# Patient Record
Sex: Male | Born: 1975 | Race: Black or African American | Hispanic: No | Smoking: Current some day smoker
Health system: Southern US, Community
[De-identification: ages and names within clinical notes are randomized; demographics above are authoritative.]

## PROBLEM LIST (undated history)

## (undated) DIAGNOSIS — I10 Essential (primary) hypertension: Secondary | ICD-10-CM

## (undated) DIAGNOSIS — E785 Hyperlipidemia, unspecified: Secondary | ICD-10-CM

## (undated) HISTORY — DX: Essential (primary) hypertension: I10

## (undated) HISTORY — DX: Hyperlipidemia, unspecified: E78.5

---

## 2001-06-11 HISTORY — PX: WISDOM TOOTH EXTRACTION: SHX21

## 2005-12-27 ENCOUNTER — Ambulatory Visit: Payer: Self-pay | Admitting: General Practice

## 2007-04-26 IMAGING — CR DG CHEST 2V
1 series · 2 of 2 positions shown · non-contrast
Comparison: none

REASON FOR EXAM: CHEST COUGH  PLEASE FAX RESULT TO [phone]
COMMENTS:

PROCEDURE:     DXR - DXR CHEST PA (OR AP) AND LATERAL  - December 27, 2005 [DATE]
RESULT:       The lungs are clear.  The cardiac silhouette and visualized
bony skeleton are unremarkable.

[Series 1: view not recorded · 0.17mm/px · 2 of 2 slices shown]
[im 1/2]
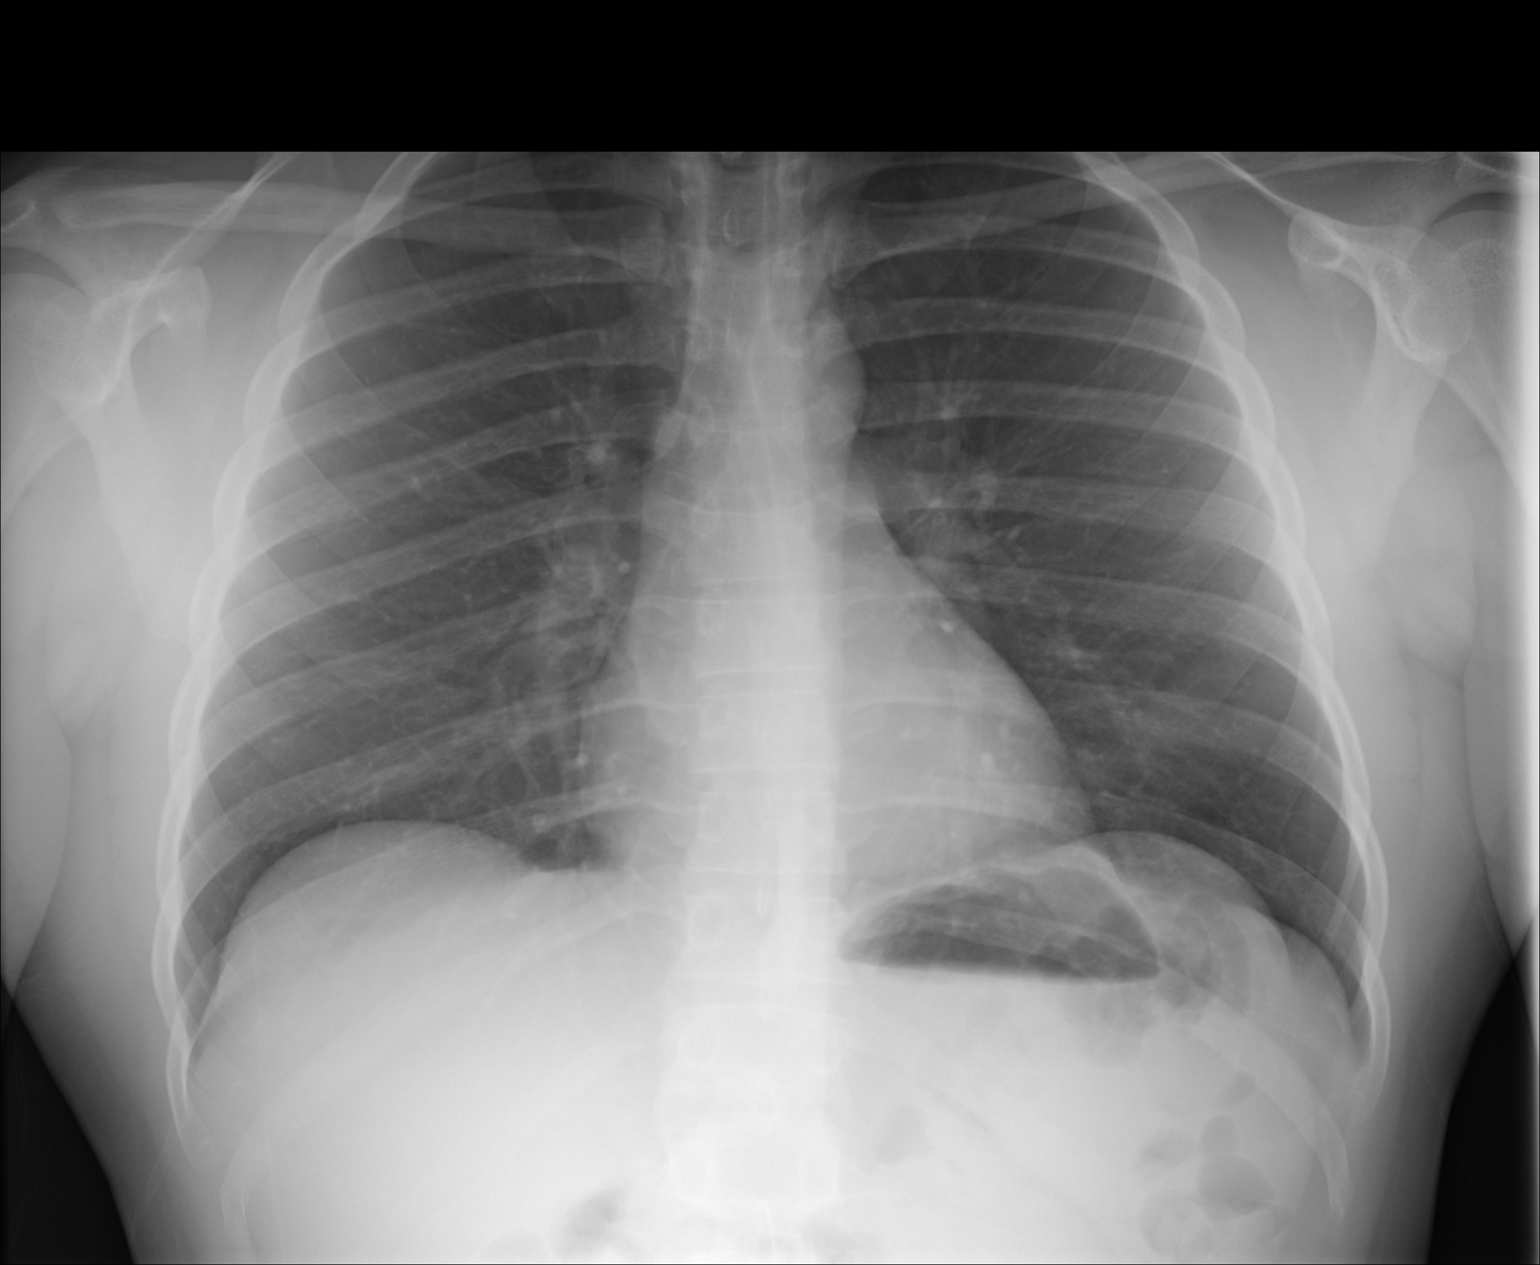
[im 2/2]
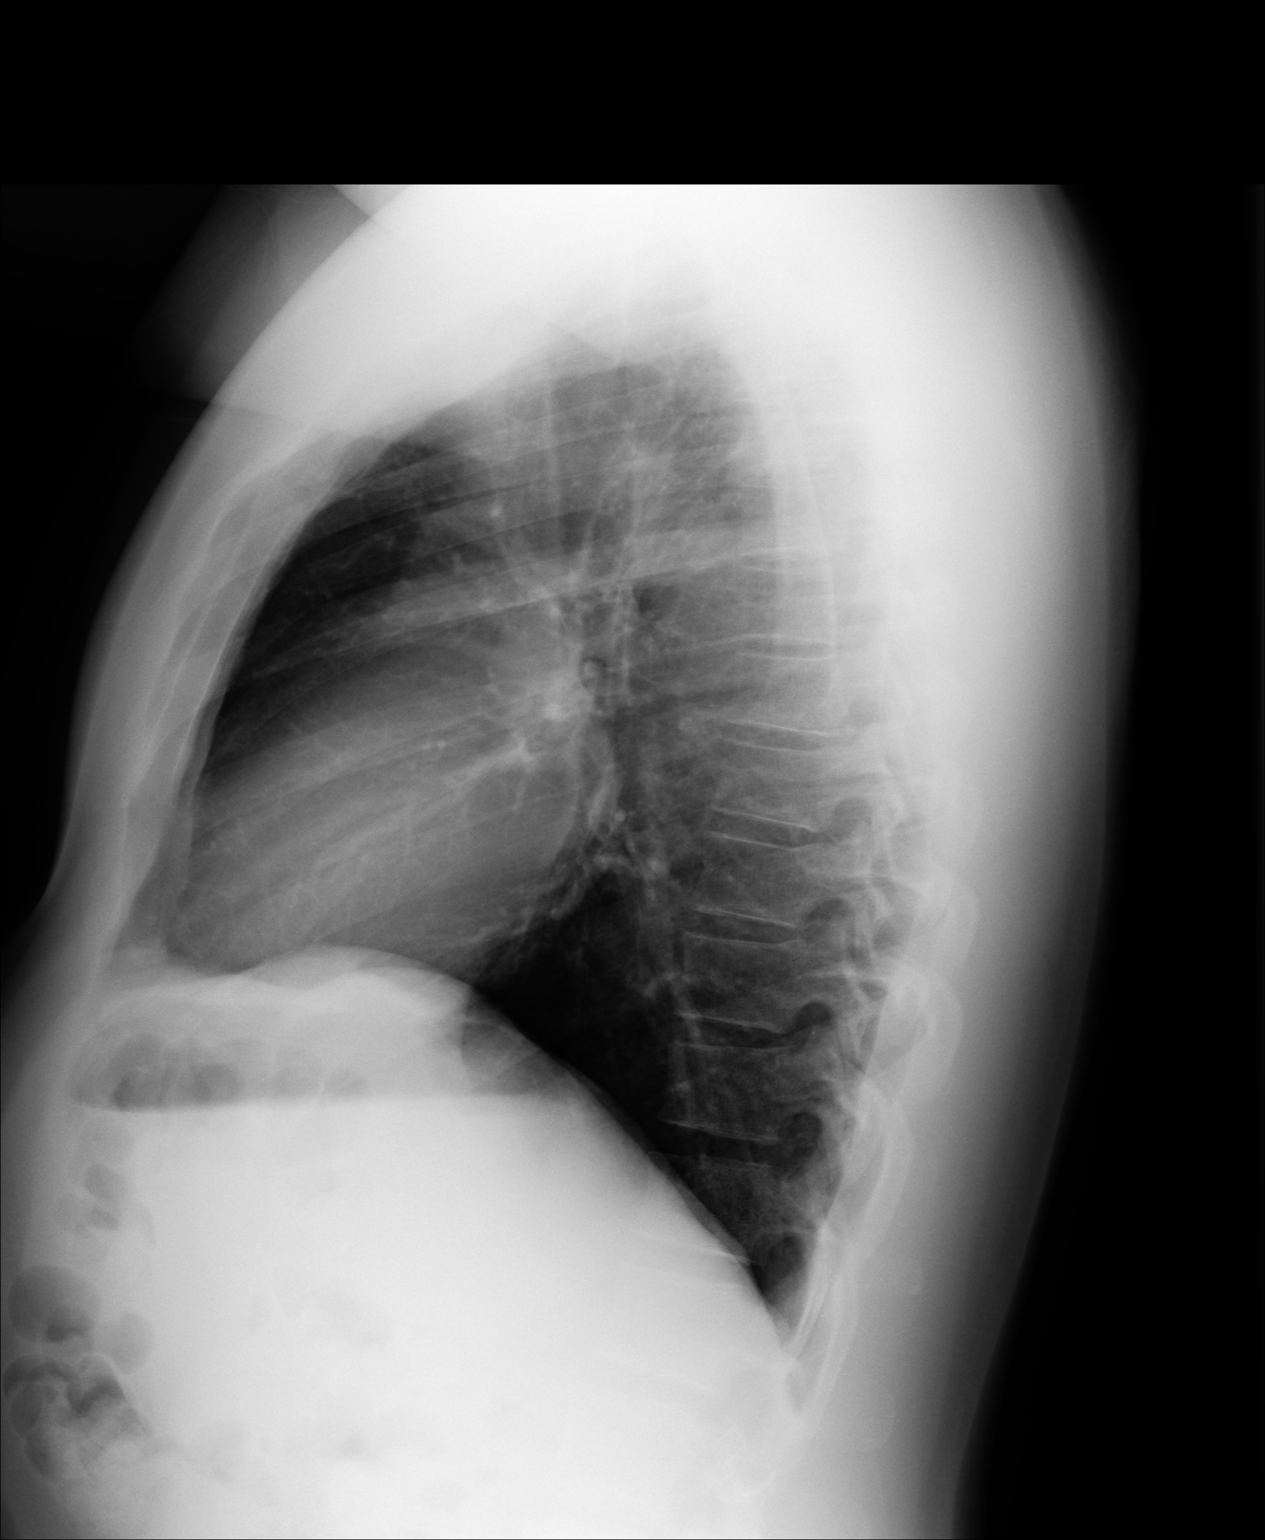

[2 of 2 positions shown; findings below may reference images not displayed]

IMPRESSION: Chest radiograph without evidence of acute cardiopulmonary
disease.

## 2007-06-08 ENCOUNTER — Emergency Department: Payer: Self-pay | Admitting: Emergency Medicine

## 2014-08-11 ENCOUNTER — Ambulatory Visit: Payer: Self-pay | Admitting: General Surgery

## 2014-08-19 ENCOUNTER — Encounter: Payer: Self-pay | Admitting: General Surgery

## 2014-08-19 ENCOUNTER — Ambulatory Visit (INDEPENDENT_AMBULATORY_CARE_PROVIDER_SITE_OTHER): Payer: BLUE CROSS/BLUE SHIELD | Admitting: General Surgery

## 2014-08-19 DIAGNOSIS — L732 Hidradenitis suppurativa: Secondary | ICD-10-CM | POA: Diagnosis not present

## 2014-08-19 NOTE — Progress Notes (Signed)
Patient ID: Keith Cohen, male   DOB: [birth year], 39 y.o.   MRN: [id number]  Chief Complaint  Patient presents with  . Other    hidradenitis    HPI Keith Cohen is a 39 y.o. male.  Here today for possible evaluation of hidradenitis both axilla. He first noticed the knots and drainage about 15 years ago and seems to have gotten worse over time. The last time he noticed drainage was 3 weeks ago. He does have some antibiotics left that he is taking because he stopped them to go on a cruise.  The right axilla improves usually before the left axilla.  Recovering from "cold" symptoms.  He did go the dermatologist about 2 years ago and took suppressive medications which helped while he was on them but reoccurred when he stopped taking the medication.   HPI  No past medical history on file.  Past Surgical History  Procedure Laterality Date  . Wisdom tooth extraction  2003    No family history on file.  Social History History  Substance Use Topics  . Smoking status: Current Some Day Smoker  . Smokeless tobacco: Current User    Types: Chew  . Alcohol Use: 0.0 oz/week    0 Standard drinks or equivalent per week     Comment: occasionally    No Known Allergies  Current Outpatient Prescriptions  Medication Sig Dispense Refill  . aspirin 81 MG tablet Take 81 mg by mouth daily.    . beta carotene w/minerals (OCUVITE) tablet Take 1 tablet by mouth daily.    . clindamycin (CLEOCIN) 300 MG capsule Take 300 mg by mouth 2 (two) times daily.  3  . COD LIVER OIL PO Take by mouth as needed.    . Multiple Vitamin (MULTIVITAMIN) capsule Take 1 capsule by mouth daily.    . Protein POWD by Does not apply route.    . rifampin (RIFADIN) 300 MG capsule Take 300 mg by mouth 2 (two) times daily.   3   No current facility-administered medications for this visit.    Review of Systems Review of Systems  Constitutional: Negative.   Respiratory: Negative.   Cardiovascular:  Negative.     Blood pressure 130/78, pulse 70, resp. rate 12, height 5\' 6"  (1.676 m), weight 195 lb (88.451 kg).  Physical Exam Physical Exam  Constitutional: He is oriented to person, place, and time. He appears well-developed and well-nourished.  Neck: Neck supple.  Cardiovascular: Normal rate, regular rhythm and normal heart sounds.   Pulmonary/Chest: Effort normal and breath sounds normal.  Abdominal: Normal appearance. No hernia.    Musculoskeletal:       Arms: Lymphadenopathy:    He has no cervical adenopathy.    He has no axillary adenopathy.  Neurological: He is alert and oriented to person, place, and time.  Skin: Skin is warm and dry.  Right lower back 1.5 cm cyst. Midline posterior lower scalp 1.5 cm cyst. Chronic skin changes 2 x 10 cm area in left axilla. Chronic skin changes 1 x 10 cm less pronounced in right axilla.       Data Reviewed PCP notes.  Assessment    Hidradenitis suppurativa:  left axilla dominant. Sebaceous cyst of the left posterior scalp and right lower back, noninflamed.    Plan    If surgical intervention were to be undertaken, I would only do the left axilla this time as there is minimal skin changes on the right. Pros and cons of surgical  excision were reviewed. 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  Patient will consider the options and notify the office of how he would like to proceed.     Discussed risk and benefits of excision hidradenitis on an outpatient basis.   PCP/REF: Kandace Parkins 08/19/2014, 10:10 AM

## 2014-08-19 NOTE — Patient Instructions (Addendum)
The patient is aware to call back for any questions or concerns.  Hidradenitis Suppurativa, Sweat Gland Abscess Hidradenitis suppurativa is a long lasting (chronic), uncommon disease of the sweat glands. With this, boil-like lumps and scarring develop in the groin, some times under the arms (axillae), and under the breasts. It may also uncommonly occur behind the ears, in the crease of the buttocks, and around the genitals.  CAUSES  The cause is from a blocking of the sweat glands. They then become infected. It may cause drainage and odor. It is not contagious. So it cannot be given to someone else. It most often shows up in puberty (about 72 to 40 years of age). But it may happen much later. It is similar to acne which is a disease of the sweat glands. This condition is slightly more common in African-Americans and women. SYMPTOMS   Hidradenitis usually starts as one or more red, tender, swellings in the groin or under the arms (axilla).  Over a period of hours to days the lesions get larger. They often open to the skin surface, draining clear to yellow-colored fluid.  The infected area heals with scarring. DIAGNOSIS  Your caregiver makes this diagnosis by looking at you. Sometimes cultures (growing germs on plates in the lab) may be taken. This is to see what germ (bacterium) is causing the infection.  TREATMENT   Topical germ killing medicine applied to the skin (antibiotics) are the treatment of choice. Antibiotics taken by mouth (systemic) are sometimes needed when the condition is getting worse or is severe.  Avoid tight-fitting clothing which traps moisture in.  Dirt does not cause hidradenitis and it is not caused by poor hygiene.  Involved areas should be cleaned daily using an antibacterial soap. Some patients find that the liquid form of Lever 2000, applied to the involved areas as a lotion after bathing, can help reduce the odor related to this condition.  Sometimes surgery is  needed to drain infected areas or remove scarred tissue. Removal of large amounts of tissue is used only in severe cases.  Birth control pills may be helpful.  Oral retinoids (vitamin A derivatives) for 6 to 12 months which are effective for acne may also help this condition.  Weight loss will improve but not cure hidradenitis. It is made worse by being overweight. But the condition is not caused by being overweight.  This condition is more common in people who have had acne.  It may become worse under stress. There is no medical cure for hidradenitis. It can be controlled, but not cured. The condition usually continues for years with periods of getting worse and getting better (remission). Document Released: 01/10/2004 Document Revised: 08/20/2011 Document Reviewed: 08/28/2013 [facility] Patient Information 2015 [place], [place]. This information is not intended to replace advice given to you by your health care provider. Make sure you discuss any questions you have with your health care provider.  Patient will call the office when ready to proceed with surgery.

## 2015-10-28 ENCOUNTER — Telehealth: Payer: Self-pay | Admitting: Family Medicine

## 2015-10-28 NOTE — Telephone Encounter (Signed)
Pt has not been seen in at least 8 years.  Pt is requesting to reestablish and have a CPE.  NO RX.  [employer].  [id number]

## 2016-02-29 ENCOUNTER — Encounter (INDEPENDENT_AMBULATORY_CARE_PROVIDER_SITE_OTHER): Payer: Self-pay

## 2016-02-29 ENCOUNTER — Encounter: Payer: Self-pay | Admitting: Physician Assistant

## 2016-02-29 ENCOUNTER — Ambulatory Visit: Payer: Self-pay | Admitting: Physician Assistant

## 2016-02-29 VITALS — BP 118/70 | HR 75 | Temp 98.4°F | Ht 67.0 in | Wt 199.0 lb

## 2016-02-29 DIAGNOSIS — Z0000 Encounter for general adult medical examination without abnormal findings: Secondary | ICD-10-CM

## 2016-02-29 DIAGNOSIS — Z299 Encounter for prophylactic measures, unspecified: Secondary | ICD-10-CM

## 2016-02-29 MED ORDER — ESZOPICLONE 2 MG PO TABS: 2.0000 mg | ORAL_TABLET | Freq: Every evening | ORAL | 3 refills | Status: DC | PRN

## 2016-02-29 NOTE — Progress Notes (Signed)
S: here for yearly physical, no complaints, no changes in his health, denies cp/sob/cough/congestion/abd pain/changes in bowel habits/changes in urinary habits/no recent weight loss, states he has gained about 10lbs since last year but hasn't been eating healthy, some problems with insomnia after working  Works EMS, +occasional smoker  Fam Hx + DM  O: vitals wnl, nad,   Ent:  tms clear, nasal mucosa pink, throat wnl, neck supple no lymph Lungs: Normal effort. Lungs are clear to auscultation, no crackles or wheezes Cardiovascular: Regular rate and rhythm, no edema Abd: soft nontender bs normal all 4 quads Musculoskeletal:  Neurovascularly intact , full rom Neurological: No new neurological deficits Rectal exam deferred by patient  A: well adult  P: diet and exercise for weight loss, lunesta 2mg  qhs prn, explained risks of becoming dependent on this medication, pt will try melatonin at 10mg  first, labs for male exec panel, hgbA1C, vit d, hep c and hiv screening

## 2016-02-29 NOTE — Addendum Note (Signed)
Addended by: Rudene Anda T on: 02/29/2016 09:58 AM   Modules accepted: Orders

## 2016-03-01 LAB — CMP12+LP+TP+TSH+6AC+PSA+CBC…
ALK PHOS: 62 IU/L (ref 39–117)
ALT: 37 IU/L (ref 0–44)
AST: 23 IU/L (ref 0–40)
Albumin/Globulin Ratio: 1.6 (ref 1.2–2.2)
Albumin: 4.5 g/dL (ref 3.5–5.5)
BASOS: 0 %
BUN / CREAT RATIO: 17 (ref 9–20)
BUN: 14 mg/dL (ref 6–24)
Basophils Absolute: 0 10*3/uL (ref 0.0–0.2)
Bilirubin Total: 0.6 mg/dL (ref 0.0–1.2)
CALCIUM: 9.6 mg/dL (ref 8.7–10.2)
CHLORIDE: 99 mmol/L (ref 96–106)
CHOL/HDL RATIO: 5.9 ratio — AB (ref 0.0–5.0)
Cholesterol, Total: 206 mg/dL — ABNORMAL HIGH (ref 100–199)
Creatinine, Ser: 0.81 mg/dL (ref 0.76–1.27)
EOS (ABSOLUTE): 0.2 10*3/uL (ref 0.0–0.4)
EOS: 3 %
Estimated CHD Risk: 1.2 times avg. — ABNORMAL HIGH (ref 0.0–1.0)
Free Thyroxine Index: 1.7 (ref 1.2–4.9)
GFR calc non Af Amer: 110 mL/min/{1.73_m2} (ref >59)
GFR, EST AFRICAN AMERICAN: 128 mL/min/{1.73_m2} (ref >59)
GGT: 40 IU/L (ref 0–65)
GLOBULIN, TOTAL: 2.8 g/dL (ref 1.5–4.5)
GLUCOSE: 119 mg/dL — AB (ref 65–99)
HDL: 35 mg/dL — ABNORMAL LOW (ref >39)
HEMATOCRIT: 43.3 % (ref 37.5–51.0)
HEMOGLOBIN: 14.9 g/dL (ref 12.6–17.7)
IMMATURE GRANS (ABS): 0 10*3/uL (ref 0.0–0.1)
IMMATURE GRANULOCYTES: 1 %
Iron: 104 ug/dL (ref 38–169)
LDH: 158 IU/L (ref 121–224)
LDL CALC: 140 mg/dL — AB (ref 0–99)
LYMPHS: 32 %
Lymphocytes Absolute: 1.7 10*3/uL (ref 0.7–3.1)
MCH: 30.1 pg (ref 26.6–33.0)
MCHC: 34.4 g/dL (ref 31.5–35.7)
MCV: 88 fL (ref 79–97)
Monocytes Absolute: 0.6 10*3/uL (ref 0.1–0.9)
Monocytes: 11 %
NEUTROS PCT: 53 %
Neutrophils Absolute: 2.9 10*3/uL (ref 1.4–7.0)
PROSTATE SPECIFIC AG, SERUM: 0.9 ng/mL (ref 0.0–4.0)
Phosphorus: 4.3 mg/dL (ref 2.5–4.5)
Platelets: 264 10*3/uL (ref 150–379)
Potassium: 4.4 mmol/L (ref 3.5–5.2)
RBC: 4.95 x10E6/uL (ref 4.14–5.80)
RDW: 12.3 % (ref 12.3–15.4)
SODIUM: 139 mmol/L (ref 134–144)
T3 Uptake Ratio: 26 % (ref 24–39)
T4, Total: 6.5 ug/dL (ref 4.5–12.0)
TRIGLYCERIDES: 157 mg/dL — AB (ref 0–149)
TSH: 1.16 u[IU]/mL (ref 0.450–4.500)
Total Protein: 7.3 g/dL (ref 6.0–8.5)
URIC ACID: 7.5 mg/dL (ref 3.7–8.6)
VLDL Cholesterol Cal: 31 mg/dL (ref 5–40)
WBC: 5.4 10*3/uL (ref 3.4–10.8)

## 2016-03-01 LAB — HCV COMMENT:

## 2016-03-01 LAB — HIV ANTIBODY (ROUTINE TESTING W REFLEX): HIV Screen 4th Generation wRfx: NONREACTIVE

## 2016-03-01 LAB — HGB A1C W/O EAG: Hgb A1c MFr Bld: 5.6 % (ref 4.8–5.6)

## 2016-03-01 LAB — VITAMIN D 25 HYDROXY (VIT D DEFICIENCY, FRACTURES): VIT D 25 HYDROXY: 19.4 ng/mL — AB (ref 30.0–100.0)

## 2016-03-01 LAB — HEPATITIS C ANTIBODY (REFLEX): HCV Ab: 0.1 s/co ratio (ref 0.0–0.9)

## 2016-03-08 LAB — ABO/RH: Rh Factor: POSITIVE

## 2016-03-08 LAB — SPECIMEN STATUS REPORT

## 2016-05-10 ENCOUNTER — Telehealth: Payer: Self-pay | Admitting: Emergency Medicine

## 2016-05-10 MED ORDER — CICLOPIROX 8 % EX SOLN: Freq: Every day | CUTANEOUS | 6 refills | Status: DC

## 2016-05-10 NOTE — Telephone Encounter (Signed)
Patient has a toe nail fungus and wants to try the [place].  Can you send it in to [facility] in [place]?

## 2016-05-10 NOTE — Telephone Encounter (Signed)
Pt called and has nail fungus, would like to try nail paint, [facility] [address]

## 2016-09-23 ENCOUNTER — Other Ambulatory Visit: Payer: Self-pay | Admitting: Physician Assistant

## 2016-09-24 NOTE — Telephone Encounter (Signed)
Med refill for vit d approved 

## 2016-10-23 ENCOUNTER — Other Ambulatory Visit: Payer: Self-pay

## 2016-10-23 NOTE — Progress Notes (Signed)
Patient came in to have his biometric screening only.  Patient already had his physical in September of 2017.

## 2017-03-27 ENCOUNTER — Ambulatory Visit: Payer: Self-pay | Admitting: Physician Assistant

## 2017-03-27 ENCOUNTER — Encounter: Payer: Self-pay | Admitting: Physician Assistant

## 2017-03-27 VITALS — BP 110/60 | HR 92 | Temp 98.5°F | Resp 16 | Ht 67.0 in | Wt 195.0 lb

## 2017-03-27 DIAGNOSIS — Z0189 Encounter for other specified special examinations: Secondary | ICD-10-CM

## 2017-03-27 DIAGNOSIS — Z0000 Encounter for general adult medical examination without abnormal findings: Secondary | ICD-10-CM

## 2017-03-27 DIAGNOSIS — Z008 Encounter for other general examination: Secondary | ICD-10-CM

## 2017-03-27 MED ORDER — TERBINAFINE HCL 250 MG PO TABS
250.0000 mg | ORAL_TABLET | Freq: Every day | ORAL | 0 refills | Status: DC
Start: 2017-03-27 — End: 2017-06-19

## 2017-03-27 NOTE — Progress Notes (Signed)
S: pt here for wellness physical and biometrics for insurance purposes, states he has a knot on his lower back, has been there about 8-9 years, just concerned, also toenails have fungus on them, used nail polish to help but it didn't work, needs refill on lunesta, no other complaints ros neg. PMH:neg    Social: works Neurosurgeon;  See chart [id number] still healthy  O: vitals wnl, nad, ENT wnl, neck supple no lymph, lungs c t a, cv rrr, abd soft nontender bs normal all 4 quads, skin with marble sized lipoma/cyst, no redness or pus, toenail on left great toe with fungal infection, discolored by 1/4 of nail  A: wellness, biometric physical, insomnia, toenail fungus, lipoma  P: rfill on lunesta 2mg  #30 3 refills, rx for lamisil 250mg  qd #30, pt to return in one month for liver enzymes, labs drawn

## 2017-03-28 LAB — CMP12+LP+TP+TSH+6AC+PSA+CBC…
A/G RATIO: 1.7 (ref 1.2–2.2)
ALT: 36 IU/L (ref 0–44)
AST: 21 IU/L (ref 0–40)
Albumin: 4.4 g/dL (ref 3.5–5.5)
Alkaline Phosphatase: 66 IU/L (ref 39–117)
BUN/Creatinine Ratio: 15 (ref 9–20)
BUN: 9 mg/dL (ref 6–24)
Basophils Absolute: 0 10*3/uL (ref 0.0–0.2)
Basos: 0 %
Bilirubin Total: 0.3 mg/dL (ref 0.0–1.2)
CREATININE: 0.6 mg/dL — AB (ref 0.76–1.27)
Calcium: 9 mg/dL (ref 8.7–10.2)
Chloride: 105 mmol/L (ref 96–106)
Chol/HDL Ratio: 6.4 ratio — ABNORMAL HIGH (ref 0.0–5.0)
Cholesterol, Total: 224 mg/dL — ABNORMAL HIGH (ref 100–199)
EOS (ABSOLUTE): 0.1 10*3/uL (ref 0.0–0.4)
ESTIMATED CHD RISK: 1.4 times avg. — AB (ref 0.0–1.0)
Eos: 2 %
Free Thyroxine Index: 1.6 (ref 1.2–4.9)
GFR calc Af Amer: 143 mL/min/{1.73_m2} (ref >59)
GFR, EST NON AFRICAN AMERICAN: 124 mL/min/{1.73_m2} (ref >59)
GGT: 58 IU/L (ref 0–65)
Globulin, Total: 2.6 g/dL (ref 1.5–4.5)
Glucose: 178 mg/dL — ABNORMAL HIGH (ref 65–99)
HDL: 35 mg/dL — AB (ref >39)
Hematocrit: 41.7 % (ref 37.5–51.0)
Hemoglobin: 14.1 g/dL (ref 13.0–17.7)
IRON: 100 ug/dL (ref 38–169)
Immature Grans (Abs): 0 10*3/uL (ref 0.0–0.1)
Immature Granulocytes: 1 %
LDH: 172 IU/L (ref 121–224)
LDL Calculated: 131 mg/dL — ABNORMAL HIGH (ref 0–99)
Lymphocytes Absolute: 2 10*3/uL (ref 0.7–3.1)
Lymphs: 35 %
MCH: 30.3 pg (ref 26.6–33.0)
MCHC: 33.8 g/dL (ref 31.5–35.7)
MCV: 90 fL (ref 79–97)
MONOS ABS: 0.4 10*3/uL (ref 0.1–0.9)
Monocytes: 8 %
NEUTROS ABS: 3.2 10*3/uL (ref 1.4–7.0)
Neutrophils: 54 %
PHOSPHORUS: 3.6 mg/dL (ref 2.5–4.5)
POTASSIUM: 3.7 mmol/L (ref 3.5–5.2)
Platelets: 253 10*3/uL (ref 150–379)
Prostate Specific Ag, Serum: 1.1 ng/mL (ref 0.0–4.0)
RBC: 4.66 x10E6/uL (ref 4.14–5.80)
RDW: 12.9 % (ref 12.3–15.4)
Sodium: 142 mmol/L (ref 134–144)
T3 UPTAKE RATIO: 27 % (ref 24–39)
T4, Total: 5.9 ug/dL (ref 4.5–12.0)
TSH: 1.01 u[IU]/mL (ref 0.450–4.500)
Total Protein: 7 g/dL (ref 6.0–8.5)
Triglycerides: 292 mg/dL — ABNORMAL HIGH (ref 0–149)
URIC ACID: 9.2 mg/dL — AB (ref 3.7–8.6)
VLDL Cholesterol Cal: 58 mg/dL — ABNORMAL HIGH (ref 5–40)
WBC: 5.8 10*3/uL (ref 3.4–10.8)

## 2017-03-28 LAB — HEPATITIS C ANTIBODY (REFLEX)

## 2017-03-28 LAB — HIV ANTIBODY (ROUTINE TESTING W REFLEX): HIV SCREEN 4TH GENERATION: NONREACTIVE

## 2017-03-28 LAB — HCV COMMENT:

## 2017-03-28 LAB — VITAMIN D 25 HYDROXY (VIT D DEFICIENCY, FRACTURES): Vit D, 25-Hydroxy: 20.2 ng/mL — ABNORMAL LOW (ref 30.0–100.0)

## 2017-03-28 NOTE — Progress Notes (Signed)
Hiv is negative also

## 2017-04-02 LAB — HGB A1C W/O EAG: HEMOGLOBIN A1C: 5.9 % — AB (ref 4.8–5.6)

## 2017-04-02 LAB — SPECIMEN STATUS REPORT

## 2017-06-18 ENCOUNTER — Other Ambulatory Visit: Payer: Self-pay

## 2017-06-18 DIAGNOSIS — Z299 Encounter for prophylactic measures, unspecified: Secondary | ICD-10-CM

## 2017-06-18 NOTE — Progress Notes (Signed)
Patient came in to have blood drawn for testing to check his liver functions for a refill for Lamisil.

## 2017-06-19 ENCOUNTER — Encounter: Payer: Self-pay | Admitting: Registered Nurse

## 2017-06-19 ENCOUNTER — Telehealth: Payer: Self-pay | Admitting: Registered Nurse

## 2017-06-19 LAB — HEPATIC FUNCTION PANEL
ALK PHOS: 66 IU/L (ref 39–117)
ALT: 43 IU/L (ref 0–44)
AST: 27 IU/L (ref 0–40)
Albumin: 4.4 g/dL (ref 3.5–5.5)
BILIRUBIN, DIRECT: 0.14 mg/dL (ref 0.00–0.40)
Bilirubin Total: 0.7 mg/dL (ref 0.0–1.2)
Total Protein: 7 g/dL (ref 6.0–8.5)

## 2017-06-19 MED ORDER — TERBINAFINE HCL 250 MG PO TABS: 250.0000 mg | ORAL_TABLET | Freq: Every day | ORAL | 0 refills | Status: DC

## 2017-06-19 NOTE — Telephone Encounter (Signed)
Patient LFTs stable with lamisil 250mg  po daily.  Electronic Rx submitted for patient to finish his 12 weeks of therapy  Terbinafine 250mg  po daily #60 RF0.  Follow up if nail discoloration continues, consider antifungal powder in shoes and open shoes wide and place in sunshine outside when weather is nice to decrease fungal counts in shoes, wash socks in hot water and dry thoroughly.

## 2018-03-31 ENCOUNTER — Encounter: Payer: Self-pay | Admitting: Family Medicine

## 2018-03-31 ENCOUNTER — Ambulatory Visit (INDEPENDENT_AMBULATORY_CARE_PROVIDER_SITE_OTHER): Payer: Managed Care, Other (non HMO) | Admitting: Family Medicine

## 2018-03-31 VITALS — BP 140/90 | HR 66 | Temp 98.4°F | Ht 67.0 in | Wt 196.2 lb

## 2018-03-31 DIAGNOSIS — E669 Obesity, unspecified: Secondary | ICD-10-CM

## 2018-03-31 DIAGNOSIS — G4726 Circadian rhythm sleep disorder, shift work type: Secondary | ICD-10-CM

## 2018-03-31 DIAGNOSIS — E559 Vitamin D deficiency, unspecified: Secondary | ICD-10-CM

## 2018-03-31 DIAGNOSIS — Z114 Encounter for screening for human immunodeficiency virus [HIV]: Secondary | ICD-10-CM

## 2018-03-31 DIAGNOSIS — Z6830 Body mass index (BMI) 30.0-30.9, adult: Secondary | ICD-10-CM

## 2018-03-31 DIAGNOSIS — Z0000 Encounter for general adult medical examination without abnormal findings: Secondary | ICD-10-CM

## 2018-03-31 DIAGNOSIS — D171 Benign lipomatous neoplasm of skin and subcutaneous tissue of trunk: Secondary | ICD-10-CM | POA: Diagnosis not present

## 2018-03-31 DIAGNOSIS — Z8042 Family history of malignant neoplasm of prostate: Secondary | ICD-10-CM

## 2018-03-31 DIAGNOSIS — L732 Hidradenitis suppurativa: Secondary | ICD-10-CM

## 2018-03-31 NOTE — Assessment & Plan Note (Signed)
Continue Lunesta as needed Taking sparingly

## 2018-03-31 NOTE — Progress Notes (Signed)
Patient: Keith Cohen, Male    DOB: 1976-06-02, 42 y.o.   MRN: [id number] Visit Date: 03/31/2018  Today's Provider: Lavon Paganini, MD   Chief Complaint  Patient presents with  . Establish Care   Subjective:  I, Keith Cohen, CMA, am acting as a scribe for Lavon Paganini, MD.   New Patient:  Keith Cohen is a 42 year old male who presents today to [facility] as a new patient. He was previously a patient at the [facility] and the employee clinic. Patient reports feeling well. He is exercising 2 days per week. He states he is sleeping well. 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  Due to the shiftwork nature of his job, he occasionally has insomnia.  He has been prescribed Lunesta in the past to take occasionally when he is switching shifts.  He only takes this about 5-6 times monthly.  Patient does smoke cigarettes.  He states he only smokes a few cigarettes daily and a pack will last for about 2 weeks.  He has been smoking for 15 to 20 years.  He does think it would be good to quit, but he is not motivated to do so yet.  Patient has a history of hidradenitis of bilateral axilla.  He denies any recent infections or flares.  He has not been on antibiotics recently.  Patient also reports a spot on his back that he would like to have looked at.  Is on the right side of his lumbar spine.  Is been stable for several years.  Prior to that, it had grown a little in size.  It is not painful or uncomfortable.  It does not drain anything.   Patient declines influenza vaccine Last tetanus shot was within the last 10 years and we will request records from his job He gets annual PSA given his family history prostate cancer He is never had colon cancer screening -----------------------------------------------------------------   Review of Systems  Constitutional: Negative.   HENT: Negative.   Eyes: Negative.   Respiratory: Negative.   Cardiovascular: Negative.    Gastrointestinal: Negative.   Endocrine: Negative.   Genitourinary: Negative.   Musculoskeletal: Negative.   Skin: Negative.   Allergic/Immunologic: Negative.   Neurological: Negative.   Hematological: Negative.   Psychiatric/Behavioral: Negative.     Social History      He  reports that he has been smoking. He has quit using smokeless tobacco.  His smokeless tobacco use included chew. He reports that he drinks alcohol. He reports that he does not use drugs.       Social History   Socioeconomic History  . Marital status: Divorced    Spouse name: Not on file  . Number of children: Not on file  . Years of education: Not on file  . Highest education level: Not on file  Occupational History  . Not on file  Social Needs  . Financial resource strain: Not on file  . Food insecurity:    Worry: Not on file    Inability: Not on file  . Transportation needs:    Medical: Not on file    Non-medical: Not on file  Tobacco Use  . Smoking status: Current Some Day Smoker  . Smokeless tobacco: Former Systems developer    Types: Chew  Substance and Sexual Activity  . Alcohol use: Yes    Alcohol/week: 0.0 standard drinks  . Drug use: No  . Sexual activity: Not on file  Lifestyle  . Physical activity:    Days per week: Not on file    Minutes per session: Not on file  . Stress: Not on file  Relationships  . Social connections:    Talks on phone: Not on file    Gets together: Not on file    Attends religious service: Not on file    Active member of club or organization: Not on file    Attends meetings of clubs or organizations: Not on file    Relationship status: Not on file  Other Topics Concern  . Not on file  Social History Narrative  . Not on file    History reviewed. No pertinent past medical history.   Patient Active Problem List   Diagnosis Date Noted  . Axillary hidradenitis suppurativa 08/19/2014    Past Surgical History:  Procedure Laterality Date  . [place]  EXTRACTION  2003    Family History        Family Status  Relation Name Status  . Mother  Alive  . Father  Alive  . PGF  Deceased  . MGM  Deceased  . MGF  Deceased  . PGM  Deceased        His family history includes Diabetes in his mother and paternal grandfather; Healthy in his father.      No Known Allergies   Current Outpatient Medications:  .  aspirin 81 MG tablet, Take 81 mg by mouth daily., Disp: , Rfl:  .  beta carotene w/minerals (OCUVITE) tablet, Take 1 tablet by mouth daily., Disp: , Rfl:  .  eszopiclone (LUNESTA) 2 MG TABS tablet, Take 1 tablet (2 mg total) by mouth at bedtime as needed for sleep. Take immediately before bedtime, Disp: 30 tablet, Rfl: 3 .  Multiple Vitamin (MULTIVITAMIN) capsule, Take 1 capsule by mouth daily., Disp: , Rfl:  .  Protein POWD, by Does not apply route., Disp: , Rfl:  .  terbinafine (LAMISIL) 250 MG tablet, Take 1 tablet (250 mg total) by mouth daily., Disp: 60 tablet, Rfl: 0 .  Vitamin D, Ergocalciferol, (DRISDOL) 50000 units CAPS capsule, TAKE ONE CAPSULE BY MOUTH WEEKLY, Disp: 12 capsule, Rfl: 3 .  COD LIVER OIL PO, Take by mouth as needed., Disp: , Rfl:    Patient Care Team: Virginia Crews, MD as PCP - General (Family Medicine) Bary Castilla Forest Gleason, MD (General Surgery)      Objective:   Vitals: BP 140/90 (BP Location: Right Arm, Patient Position: Sitting, Cuff Size: Large)   Pulse 66   Temp 98.4 F (36.9 C) (Oral)   Ht 5\' 7"  (1.702 m)   Wt 196 lb 3.2 oz (89 kg)   SpO2 96%   BMI 30.73 kg/m    Vitals:   03/31/18 0857  BP: 140/90  Pulse: 66  Temp: 98.4 F (36.9 C)  TempSrc: Oral  SpO2: 96%  Weight: 196 lb 3.2 oz (89 kg)  Height: 5\' 7"  (1.702 m)     Physical Exam  Constitutional: He is oriented to person, place, and time. He appears well-developed and well-nourished. No distress.  HENT:  Head: Normocephalic and atraumatic.  Right Ear: External ear normal.  Left Ear: External ear normal.  Nose: Nose normal.    Mouth/Throat: Oropharynx is clear and moist.  Eyes: Pupils are equal, round, and reactive to light. Conjunctivae and EOM are normal. No scleral icterus.  Neck: Neck supple. No thyromegaly present.  Cardiovascular: Normal rate, regular rhythm, normal heart sounds and intact  distal pulses.  No murmur heard. Pulmonary/Chest: Effort normal and breath sounds normal. No respiratory distress. He has no wheezes. He has no rales.  Abdominal: Soft. Bowel sounds are normal. He exhibits no distension. There is no tenderness. There is no rebound and no guarding.  Musculoskeletal: He exhibits no edema or deformity.  Lymphadenopathy:    He has no cervical adenopathy.  Neurological: He is alert and oriented to person, place, and time.  Skin: Skin is warm and dry. Capillary refill takes less than 2 seconds. No rash noted.  Soft and mobile lipoma of right lower back that is not tender to palpation  Psychiatric: He has a normal mood and affect. His behavior is normal.  Vitals reviewed.    Depression Screen PHQ 2/9 Scores 03/31/2018  PHQ - 2 Score 0  PHQ- 9 Score 0      Assessment & Plan:     Routine Health Maintenance and Physical Exam  Exercise Activities and Dietary recommendations Goals    . Quit smoking / using tobacco        There is no immunization history on file for this patient.  Health Maintenance  Topic Date Due  . TETANUS/TDAP  12/18/1993  . INFLUENZA VACCINE  10/09/2018 (Originally 01/09/2018)  . HIV Screening  Completed     Discussed health benefits of physical activity, and encouraged him to engage in regular exercise appropriate for his age and condition.    Advised to start colon cancer screening at age 45 as he is African-American --------------------------------------------------------------------  Problem List Items Addressed This Visit      Musculoskeletal and Integument   Axillary hidradenitis suppurativa    No recent flares or outbreaks Continue to  monitor        Other   Lipoma of torso    Reassurance given If starts to grow in size or become irritated/inflamed, could consider surgery referral for possible removal      Family history of prostate cancer    Check annual PSA      Relevant Orders   PSA Total (Reflex To Free)   Sleep disorder, shift-work    Continue Lunesta as needed Taking sparingly       Other Visit Diagnoses    Encounter for annual physical exam    -  Primary   Relevant Orders   Lipid panel   Comprehensive metabolic panel   VITAMIN D 25 Hydroxy (Vit-D Deficiency, Fractures)   CBC w/Diff/Platelet   PSA Total (Reflex To Free)   HIV antibody (with reflex)   Encounter for screening for HIV       Relevant Orders   HIV antibody (with reflex)   Class 1 obesity without serious comorbidity with body mass index (BMI) of 30.0 to 30.9 in adult, unspecified obesity type       Relevant Orders   Lipid panel   Comprehensive metabolic panel   CBC w/Diff/Platelet   Avitaminosis D       Relevant Orders   VITAMIN D 25 Hydroxy (Vit-D Deficiency, Fractures)       Return in about 1 year (around 04/01/2019) for CPE.   The entirety of the information documented in the History of Present Illness, Review of Systems and Physical Exam were personally obtained by me. Portions of this information were initially documented by Keith Cohen, CMA and reviewed by me for thoroughness and accuracy.    Virginia Crews, MD, MPH [facility] 03/31/2018 3:35 PM

## 2018-03-31 NOTE — Assessment & Plan Note (Signed)
No recent flares or outbreaks Continue to monitor

## 2018-03-31 NOTE — Patient Instructions (Signed)

## 2018-03-31 NOTE — Assessment & Plan Note (Signed)
Check annual PSA

## 2018-03-31 NOTE — Assessment & Plan Note (Signed)
Reassurance given If starts to grow in size or become irritated/inflamed, could consider surgery referral for possible removal

## 2018-04-01 LAB — CBC WITH DIFFERENTIAL/PLATELET
BASOS ABS: 0 10*3/uL (ref 0.0–0.2)
BASOS: 1 %
EOS (ABSOLUTE): 0.1 10*3/uL (ref 0.0–0.4)
EOS: 2 %
HEMATOCRIT: 41.6 % (ref 37.5–51.0)
HEMOGLOBIN: 14.4 g/dL (ref 13.0–17.7)
IMMATURE GRANS (ABS): 0 10*3/uL (ref 0.0–0.1)
Immature Granulocytes: 1 %
LYMPHS: 30 %
Lymphocytes Absolute: 1.7 10*3/uL (ref 0.7–3.1)
MCH: 31.3 pg (ref 26.6–33.0)
MCHC: 34.6 g/dL (ref 31.5–35.7)
MCV: 90 fL (ref 79–97)
MONOCYTES: 8 %
Monocytes Absolute: 0.5 10*3/uL (ref 0.1–0.9)
Neutrophils Absolute: 3.4 10*3/uL (ref 1.4–7.0)
Neutrophils: 58 %
Platelets: 255 10*3/uL (ref 150–450)
RBC: 4.6 x10E6/uL (ref 4.14–5.80)
RDW: 12.4 % (ref 12.3–15.4)
WBC: 5.8 10*3/uL (ref 3.4–10.8)

## 2018-04-01 LAB — LIPID PANEL
CHOLESTEROL TOTAL: 244 mg/dL — AB (ref 100–199)
Chol/HDL Ratio: 6.1 ratio — ABNORMAL HIGH (ref 0.0–5.0)
HDL: 40 mg/dL (ref >39)
TRIGLYCERIDES: 430 mg/dL — AB (ref 0–149)

## 2018-04-01 LAB — COMPREHENSIVE METABOLIC PANEL
A/G RATIO: 1.8 (ref 1.2–2.2)
ALBUMIN: 4.6 g/dL (ref 3.5–5.5)
ALK PHOS: 62 IU/L (ref 39–117)
ALT: 45 IU/L — AB (ref 0–44)
AST: 31 IU/L (ref 0–40)
BILIRUBIN TOTAL: 0.5 mg/dL (ref 0.0–1.2)
BUN/Creatinine Ratio: 19 (ref 9–20)
BUN: 13 mg/dL (ref 6–24)
CHLORIDE: 103 mmol/L (ref 96–106)
CO2: 20 mmol/L (ref 20–29)
Calcium: 9.5 mg/dL (ref 8.7–10.2)
Creatinine, Ser: 0.69 mg/dL — ABNORMAL LOW (ref 0.76–1.27)
GFR calc Af Amer: 134 mL/min/{1.73_m2} (ref >59)
GFR, EST NON AFRICAN AMERICAN: 116 mL/min/{1.73_m2} (ref >59)
GLOBULIN, TOTAL: 2.6 g/dL (ref 1.5–4.5)
Glucose: 105 mg/dL — ABNORMAL HIGH (ref 65–99)
POTASSIUM: 4 mmol/L (ref 3.5–5.2)
SODIUM: 143 mmol/L (ref 134–144)
Total Protein: 7.2 g/dL (ref 6.0–8.5)

## 2018-04-01 LAB — VITAMIN D 25 HYDROXY (VIT D DEFICIENCY, FRACTURES): VIT D 25 HYDROXY: 15.3 ng/mL — AB (ref 30.0–100.0)

## 2018-04-01 LAB — PSA TOTAL (REFLEX TO FREE): PROSTATE SPECIFIC AG, SERUM: 1.3 ng/mL (ref 0.0–4.0)

## 2018-04-01 LAB — HIV ANTIBODY (ROUTINE TESTING W REFLEX): HIV Screen 4th Generation wRfx: NONREACTIVE

## 2018-04-21 LAB — HEPATITIS B SURFACE AB-POST VC IMM ST: Hep B S Ab: POSITIVE

## 2018-07-04 ENCOUNTER — Encounter: Payer: Self-pay | Admitting: Family Medicine

## 2018-07-04 ENCOUNTER — Ambulatory Visit (INDEPENDENT_AMBULATORY_CARE_PROVIDER_SITE_OTHER): Payer: Managed Care, Other (non HMO) | Admitting: Family Medicine

## 2018-07-04 VITALS — BP 130/65 | HR 64 | Temp 98.2°F | Wt 201.8 lb

## 2018-07-04 DIAGNOSIS — E559 Vitamin D deficiency, unspecified: Secondary | ICD-10-CM | POA: Diagnosis not present

## 2018-07-04 DIAGNOSIS — E781 Pure hyperglyceridemia: Secondary | ICD-10-CM | POA: Diagnosis not present

## 2018-07-04 DIAGNOSIS — E1169 Type 2 diabetes mellitus with other specified complication: Secondary | ICD-10-CM | POA: Insufficient documentation

## 2018-07-04 DIAGNOSIS — E782 Mixed hyperlipidemia: Secondary | ICD-10-CM | POA: Insufficient documentation

## 2018-07-04 MED ORDER — VITAMIN D (ERGOCALCIFEROL) 1.25 MG (50000 UNIT) PO CAPS: 50000.0000 [IU] | ORAL_CAPSULE | ORAL | 3 refills | Status: DC

## 2018-07-04 NOTE — Assessment & Plan Note (Signed)
Will resume weekly supplement and then recheck level at next visit

## 2018-07-04 NOTE — Assessment & Plan Note (Signed)
New problem Was not fasting on last labs Will recheck FLP and reassess Discussed diet and exercise

## 2018-07-04 NOTE — Patient Instructions (Signed)

## 2018-07-04 NOTE — Progress Notes (Signed)
Patient: Keith Cohen Male    DOB: 09-03-1975   43 y.o.   MRN: [id number] Visit Date: 07/04/2018  Today's Provider: Lavon Paganini, MD   Chief Complaint  Patient presents with  . Abnormal labs   Subjective:    I, [name], CMA, am acting as a scribe for Lavon Paganini, MD.   HPI Patient presents for a 3 month follow up. Last OV was on 03/31/2018. Patient had labs done. Results showed elevated Triglycerides and Vitamin D levels were low. Patient advised to follow up in 3 months to check fasting labs.   He has not been taking Vit D supplement for a while.  Lab Results  Component Value Date   CHOL 244 (H) 03/31/2018   HDL 40 03/31/2018   LDLCALC Comment 03/31/2018   TRIG 430 (H) 03/31/2018   CHOLHDL 6.1 (H) 03/31/2018    No Known Allergies   Current Outpatient Medications:  .  aspirin 81 MG tablet, Take 81 mg by mouth daily., Disp: , Rfl:  .  beta carotene w/minerals (OCUVITE) tablet, Take 1 tablet by mouth daily., Disp: , Rfl:  .  COD LIVER OIL PO, Take by mouth as needed., Disp: , Rfl:  .  eszopiclone (LUNESTA) 2 MG TABS tablet, Take 1 tablet (2 mg total) by mouth at bedtime as needed for sleep. Take immediately before bedtime, Disp: 30 tablet, Rfl: 3 .  Multiple Vitamin (MULTIVITAMIN) capsule, Take 1 capsule by mouth daily., Disp: , Rfl:  .  Protein POWD, by Does not apply route., Disp: , Rfl:  .  Vitamin D, Ergocalciferol, (DRISDOL) 1.25 MG (50000 UT) CAPS capsule, Take 1 capsule (50,000 Units total) by mouth once a week., Disp: 12 capsule, Rfl: 3  Review of Systems  Constitutional: Negative.   Respiratory: Negative.   Cardiovascular: Negative.   Musculoskeletal: Negative.     Social History   Tobacco Use  . Smoking status: Current Some Day Smoker    Packs/day: 0.25    Years: 15.00    Pack years: 3.75    Types: Cigarettes  . Smokeless tobacco: Former Systems developer    Types: Chew  Substance Use Topics  . Alcohol use: Yes    Alcohol/week: 10.0  standard drinks    Types: 10 Cans of beer per week      Objective:   BP 130/65   Pulse 64   Temp 98.2 F (36.8 C) (Oral)   Wt 201 lb 12.8 oz (91.5 kg)   SpO2 97%   BMI 31.61 kg/m  Vitals:   07/04/18 0941 07/04/18 0953  BP: (!) 151/96 130/65  Pulse: 64   Temp: 98.2 F (36.8 C)   TempSrc: Oral   SpO2: 97%   Weight: 201 lb 12.8 oz (91.5 kg)      Physical Exam Vitals signs reviewed.  Constitutional:      General: He is not in acute distress.    Appearance: Normal appearance.  HENT:     Head: Normocephalic and atraumatic.  Eyes:     General: No scleral icterus.    Conjunctiva/sclera: Conjunctivae normal.  Neck:     Musculoskeletal: Neck supple.  Cardiovascular:     Rate and Rhythm: Normal rate and regular rhythm.     Pulses: Normal pulses.     Heart sounds: Normal heart sounds. No murmur.  Pulmonary:     Effort: Pulmonary effort is normal. No respiratory distress.     Breath sounds: Normal breath sounds. No wheezing or rhonchi.  Musculoskeletal:     Right lower leg: No edema.     Left lower leg: No edema.  Lymphadenopathy:     Cervical: No cervical adenopathy.  Skin:    Capillary Refill: Capillary refill takes less than 2 seconds.     Findings: No rash.  Neurological:     Mental Status: He is alert and oriented to person, place, and time. Mental status is at baseline.  Psychiatric:        Mood and Affect: Mood normal.        Behavior: Behavior normal.         Assessment & Plan   Problem List Items Addressed This Visit      Other   Hypertriglyceridemia - Primary    New problem Was not fasting on last labs Will recheck FLP and reassess Discussed diet and exercise      Relevant Orders   Lipid panel   Avitaminosis D    Will resume weekly supplement and then recheck level at next visit          Return if symptoms worsen or fail to improve.   The entirety of the information documented in the History of Present Illness, Review of Systems and  Physical Exam were personally obtained by me. Portions of this information were initially documented by [name], CMA and reviewed by me for thoroughness and accuracy.    Virginia Crews, MD, MPH [facility] 07/04/2018 10:05 AM

## 2018-07-05 LAB — LIPID PANEL
CHOL/HDL RATIO: 6 ratio — AB (ref 0.0–5.0)
Cholesterol, Total: 216 mg/dL — ABNORMAL HIGH (ref 100–199)
HDL: 36 mg/dL — AB (ref >39)
LDL Calculated: 138 mg/dL — ABNORMAL HIGH (ref 0–99)
TRIGLYCERIDES: 209 mg/dL — AB (ref 0–149)
VLDL CHOLESTEROL CAL: 42 mg/dL — AB (ref 5–40)

## 2018-07-07 ENCOUNTER — Telehealth: Payer: Self-pay

## 2018-07-07 MED ORDER — ESZOPICLONE 2 MG PO TABS: 2.0000 mg | ORAL_TABLET | Freq: Every evening | ORAL | 3 refills | Status: DC | PRN

## 2018-07-07 NOTE — Telephone Encounter (Signed)
LMTCB

## 2018-07-07 NOTE — Telephone Encounter (Signed)
-----   Message from Virginia Crews, MD sent at 07/07/2018  8:21 AM EST ----- Triglycerides are much better.  Cholesterol is high, but 10 year risk of heart disease/stroke is low at 4.3%. No medications indicated at this time.  Recommend diet low in saturated fats and regular exercise - 30 min/day at least 5 times weekly to lower cholesterol.

## 2018-07-07 NOTE — Telephone Encounter (Signed)
Patient advised as below.  

## 2018-09-02 ENCOUNTER — Telehealth: Payer: Self-pay

## 2018-09-02 NOTE — Telephone Encounter (Signed)
The patient called and inquired about getting a Biometric completed for the [employer]. He has recently had labs drawn and will not require the lab for the physical only the evaluation. If he has had a physical with his PCP recently he can have his PCP fill out this form and not have to come in for evaluation.

## 2018-09-03 NOTE — Telephone Encounter (Signed)
Patient's last physical was in 03/2018.  He is not due for a CPE.  We are also not doing physicals or routine things in office currently due to COVID-19 pandemic.  We are only seeing uncontrolled BP/diabetes or sick visits.

## 2019-04-03 ENCOUNTER — Encounter: Payer: Managed Care, Other (non HMO) | Admitting: Family Medicine

## 2019-04-09 ENCOUNTER — Other Ambulatory Visit: Payer: Self-pay

## 2019-04-09 ENCOUNTER — Encounter: Payer: Self-pay | Admitting: Family Medicine

## 2019-04-09 ENCOUNTER — Ambulatory Visit (INDEPENDENT_AMBULATORY_CARE_PROVIDER_SITE_OTHER): Payer: Managed Care, Other (non HMO) | Admitting: Family Medicine

## 2019-04-09 VITALS — BP 134/85 | HR 61 | Temp 96.9°F | Resp 16 | Ht 67.0 in | Wt 196.0 lb

## 2019-04-09 DIAGNOSIS — E782 Mixed hyperlipidemia: Secondary | ICD-10-CM

## 2019-04-09 DIAGNOSIS — E559 Vitamin D deficiency, unspecified: Secondary | ICD-10-CM

## 2019-04-09 DIAGNOSIS — Z0000 Encounter for general adult medical examination without abnormal findings: Secondary | ICD-10-CM

## 2019-04-09 DIAGNOSIS — E781 Pure hyperglyceridemia: Secondary | ICD-10-CM | POA: Diagnosis not present

## 2019-04-09 DIAGNOSIS — R739 Hyperglycemia, unspecified: Secondary | ICD-10-CM

## 2019-04-09 DIAGNOSIS — G4726 Circadian rhythm sleep disorder, shift work type: Secondary | ICD-10-CM | POA: Diagnosis not present

## 2019-04-09 DIAGNOSIS — Z8042 Family history of malignant neoplasm of prostate: Secondary | ICD-10-CM

## 2019-04-09 NOTE — Assessment & Plan Note (Signed)
Continue to monitor annual PSA Patient is asymptomatic 

## 2019-04-09 NOTE — Progress Notes (Signed)
Patient: Keith Cohen, Male    DOB: Nov 04, 1975, 43 y.o.   MRN: [id number] Visit Date: 04/09/2019  Today's Provider: Lavon Paganini, MD   Chief Complaint  Patient presents with  . Annual Exam   Subjective:     Annual physical exam Keith Cohen is a 43 y.o. male who presents today for health maintenance and complete physical. He feels well. He reports exercising no. He reports he is sleeping well. 03/31/2018 CPE 03/31/2018 PSA 1.3 10/16/2010 Tdap ----------------------------------------------------------------- Smoking intermittently when drinking, not daily  Drinking 6 beers per day on the weekends when not working  Review of Systems  Constitutional: Negative.   HENT: Negative.   Eyes: Negative.   Respiratory: Negative.   Cardiovascular: Negative.   Gastrointestinal: Negative.   Endocrine: Negative.   Genitourinary: Negative.   Musculoskeletal: Negative.   Skin: Negative.   Allergic/Immunologic: Negative.   Neurological: Negative.   Hematological: Negative.   Psychiatric/Behavioral: Negative.     Social History      He  reports that he has been smoking cigarettes. He has a 3.75 pack-year smoking history. He has quit using smokeless tobacco.  His smokeless tobacco use included chew. He reports current alcohol use of about 10.0 standard drinks of alcohol per week. He reports that he does not use drugs.       Social History   Socioeconomic History  . Marital status: Divorced    Spouse name: Not on file  . Number of children: 0  . Years of education: Not on file  . Highest education level: Not on file  Occupational History  . Not on file  Social Needs  . Financial resource strain: Not on file  . Food insecurity    Worry: Not on file    Inability: Not on file  . Transportation needs    Medical: Not on file    Non-medical: Not on file  Tobacco Use  . Smoking status: Current Some Day Smoker    Packs/day: 0.25    Years: 15.00    Pack  years: 3.75    Types: Cigarettes  . Smokeless tobacco: Former Systems developer    Types: Chew  Substance and Sexual Activity  . Alcohol use: Yes    Alcohol/week: 10.0 standard drinks    Types: 10 Cans of beer per week  . Drug use: No  . Sexual activity: Yes    Partners: Female    Birth control/protection: Condom  Lifestyle  . Physical activity    Days per week: Not on file    Minutes per session: Not on file  . Stress: Not on file  Relationships  . Social Herbalist on phone: Not on file    Gets together: Not on file    Attends religious service: Not on file    Active member of club or organization: Not on file    Attends meetings of clubs or organizations: Not on file    Relationship status: Not on file  Other Topics Concern  . Not on file  Social History Narrative  . Not on file    History reviewed. No pertinent past medical history.   Patient Active Problem List   Diagnosis Date Noted  . Hypertriglyceridemia 07/04/2018  . Avitaminosis D 07/04/2018  . Lipoma of torso 03/31/2018  . Family history of prostate cancer 03/31/2018  . Sleep disorder, shift-work 03/31/2018  . Axillary hidradenitis suppurativa 08/19/2014    Past Surgical History:  Procedure Laterality  Date  . WISDOM TOOTH EXTRACTION  2003    Family History        Family Status  Relation Name Status  . Mother  Alive  . Father  Alive  . PGF  Deceased  . MGM  Deceased  . MGF  Deceased  . PGM  Deceased  . Annamarie Major  (Not Specified)  . Neg Hx  (Not Specified)        His family history includes Diabetes in his mother and paternal grandfather; Healthy in his father; Prostate cancer in his paternal uncle. There is no history of Colon cancer or Breast cancer.      No Known Allergies   Current Outpatient Medications:  .  COD LIVER OIL PO, Take by mouth as needed., Disp: , Rfl:  .  eszopiclone (LUNESTA) 2 MG TABS tablet, Take 1 tablet (2 mg total) by mouth at bedtime as needed for sleep. Take  immediately before bedtime, Disp: 30 tablet, Rfl: 3 .  Multiple Vitamin (MULTIVITAMIN) capsule, Take 1 capsule by mouth daily., Disp: , Rfl:  .  Vitamin D, Ergocalciferol, (DRISDOL) 1.25 MG (50000 UT) CAPS capsule, Take 1 capsule (50,000 Units total) by mouth once a week., Disp: 12 capsule, Rfl: 3   Patient Care Team: Virginia Crews, MD as PCP - General (Family Medicine) Robert Bellow, MD (General Surgery)    Objective:    Vitals: BP 134/85 (BP Location: Right Arm, Patient Position: Sitting, Cuff Size: Normal)   Pulse 61   Temp (!) 96.9 F (36.1 C) (Temporal)   Resp 16   Ht 5\' 7"  (1.702 m)   Wt 196 lb (88.9 kg)   BMI 30.70 kg/m    Vitals:   04/09/19 0957  BP: 134/85  Pulse: 61  Resp: 16  Temp: (!) 96.9 F (36.1 C)  TempSrc: Temporal  Weight: 196 lb (88.9 kg)  Height: 5\' 7"  (1.702 m)     Physical Exam Vitals signs reviewed.  Constitutional:      General: He is not in acute distress.    Appearance: Normal appearance. He is well-developed. He is not diaphoretic.  HENT:     Head: Normocephalic and atraumatic.     Right Ear: Tympanic membrane, ear canal and external ear normal.     Left Ear: Tympanic membrane, ear canal and external ear normal.  Eyes:     General: No scleral icterus.    Conjunctiva/sclera: Conjunctivae normal.     Pupils: Pupils are equal, round, and reactive to light.  Neck:     Musculoskeletal: Neck supple.     Thyroid: No thyromegaly.  Cardiovascular:     Rate and Rhythm: Normal rate and regular rhythm.     Pulses: Normal pulses.     Heart sounds: Normal heart sounds. No murmur.  Pulmonary:     Effort: Pulmonary effort is normal. No respiratory distress.     Breath sounds: Normal breath sounds. No wheezing or rales.  Abdominal:     General: There is no distension.     Palpations: Abdomen is soft.     Tenderness: There is no abdominal tenderness.  Musculoskeletal:        General: No deformity.     Right lower leg: No edema.      Left lower leg: No edema.  Lymphadenopathy:     Cervical: No cervical adenopathy.  Skin:    General: Skin is warm and dry.     Capillary Refill: Capillary refill takes less than 2  seconds.     Findings: No rash.  Neurological:     Mental Status: He is alert and oriented to person, place, and time. Mental status is at baseline.  Psychiatric:        Mood and Affect: Mood normal.        Behavior: Behavior normal.        Thought Content: Thought content normal.      Depression Screen PHQ 2/9 Scores 04/09/2019 03/31/2018  PHQ - 2 Score 0 0  PHQ- 9 Score 0 0       Assessment & Plan:     Routine Health Maintenance and Physical Exam  Exercise Activities and Dietary recommendations Goals    . Quit smoking / using tobacco       Immunization History  Administered Date(s) Administered  . Tdap 10/16/2010    Health Maintenance  Topic Date Due  . INFLUENZA VACCINE  01/10/2019  . TETANUS/TDAP  10/15/2020  . HIV Screening  Completed     Discussed health benefits of physical activity, and encouraged him to engage in regular exercise appropriate for his age and condition.    --------------------------------------------------------------------  Problem List Items Addressed This Visit      Other   Family history of prostate cancer    Continue to monitor annual PSA Patient is asymptomatic      Relevant Orders   PSA   Sleep disorder, shift-work    Continue Lunesta as needed Taking very sparingly      Mixed hyperlipidemia    Not on a statin Repeat FLP and CMP We will calculate ASCVD risk to determine need for statin for primary prevention      Avitaminosis D    Recheck vitamin D level      Relevant Orders   VITAMIN D 25 Hydroxy (Vit-D Deficiency, Fractures)    Other Visit Diagnoses    Encounter for annual physical exam    -  Primary   Relevant Orders   CBC with Differential/Platelet   Comprehensive metabolic panel   Lipid Panel With LDL/HDL Ratio   PSA    TSH   VITAMIN D 25 Hydroxy (Vit-D Deficiency, Fractures)   Hemoglobin A1c   Hyperglycemia       Relevant Orders   Hemoglobin A1c       Return in about 1 year (around 04/08/2020) for CPE.   The entirety of the information documented in the History of Present Illness, Review of Systems and Physical Exam were personally obtained by me. Portions of this information were initially documented by Lynford Humphrey, CMA and reviewed by me for thoroughness and accuracy.    Keith Cohen, Dionne Bucy, MD MPH [facility]

## 2019-04-09 NOTE — Assessment & Plan Note (Signed)
Recheck vitamin D level 

## 2019-04-09 NOTE — Assessment & Plan Note (Signed)
Not on a statin Repeat FLP and CMP We will calculate ASCVD risk to determine need for statin for primary prevention

## 2019-04-09 NOTE — Patient Instructions (Signed)
Preventive Care 44-44 Years Old, Male Preventive care refers to lifestyle choices and visits with your health care provider that can promote health and wellness. This includes:  A yearly physical exam. This is also called an annual well check.  Regular dental and eye exams.  Immunizations.  Screening for certain conditions.  Healthy lifestyle choices, such as eating a healthy diet, getting regular exercise, not using drugs or products that contain nicotine and tobacco, and limiting alcohol use. What can I expect for my preventive care visit? Physical exam Your health care provider will check:  Height and weight. These may be used to calculate body mass index (BMI), which is a measurement that tells if you are at a healthy weight.  Heart rate and blood pressure.  Your skin for abnormal spots. Counseling Your health care provider may ask you questions about:  Alcohol, tobacco, and drug use.  Emotional well-being.  Home and relationship well-being.  Sexual activity.  Eating habits.  Work and work environment. What immunizations do I need?  Influenza (flu) vaccine  This is recommended every year. Tetanus, diphtheria, and pertussis (Tdap) vaccine  You may need a Td booster every 10 years. Varicella (chickenpox) vaccine  You may need this vaccine if you have not already been vaccinated. Zoster (shingles) vaccine  You may need this after age 60. Measles, mumps, and rubella (MMR) vaccine  You may need at least one dose of MMR if you were born in 1957 or later. You may also need a second dose. Pneumococcal conjugate (PCV13) vaccine  You may need this if you have certain conditions and were not previously vaccinated. Pneumococcal polysaccharide (PPSV23) vaccine  You may need one or two doses if you smoke cigarettes or if you have certain conditions. Meningococcal conjugate (MenACWY) vaccine  You may need this if you have certain conditions. Hepatitis A vaccine   You may need this if you have certain conditions or if you travel or work in places where you may be exposed to hepatitis A. Hepatitis B vaccine  You may need this if you have certain conditions or if you travel or work in places where you may be exposed to hepatitis B. Haemophilus influenzae type b (Hib) vaccine  You may need this if you have certain risk factors. Human papillomavirus (HPV) vaccine  If recommended by your health care provider, you may need three doses over 6 months. You may receive vaccines as individual doses or as more than one vaccine together in one shot (combination vaccines). Talk with your health care provider about the risks and benefits of combination vaccines. What tests do I need? Blood tests  Lipid and cholesterol levels. These may be checked every 5 years, or more frequently if you are over 44 years old.  Hepatitis C test.  Hepatitis B test. Screening  Lung cancer screening. You may have this screening every year starting at age 44 if you have a 30-pack-year history of smoking and currently smoke or have quit within the past 15 years.  Prostate cancer screening. Recommendations will vary depending on your family history and other risks.  Colorectal cancer screening. All adults should have this screening starting at age 44 and continuing until age 75. Your health care provider may recommend screening at age 45 if you are at increased risk. You will have tests every 1-10 years, depending on your results and the type of screening test.  Diabetes screening. This is done by checking your blood sugar (glucose) after you have not eaten   for a while (fasting). You may have this done every 1-3 years.  Sexually transmitted disease (STD) testing. Follow these instructions at home: Eating and drinking  Eat a diet that includes fresh fruits and vegetables, whole grains, lean protein, and low-fat dairy products.  Take vitamin and mineral supplements as recommended  by your health care provider.  Do not drink alcohol if your health care provider tells you not to drink.  If you drink alcohol: ? Limit how much you have to 0-2 drinks a day. ? Be aware of how much alcohol is in your drink. In the [place] one drink equals one 12 oz bottle of beer (355 mL), one 5 oz glass of wine (148 mL), or one 1 oz glass of hard liquor (44 mL). Lifestyle  Take daily care of your teeth and gums.  Stay active. Exercise for at least 30 minutes on 5 or more days each week.  Do not use any products that contain nicotine or tobacco, such as cigarettes, e-cigarettes, and chewing tobacco. If you need help quitting, ask your health care provider.  If you are sexually active, practice safe sex. Use a condom or other form of protection to prevent STIs (sexually transmitted infections).  Talk with your health care provider about taking a low-dose aspirin every day starting at age 44. What's next?  Go to your health care provider once a year for a well check visit.  Ask your health care provider how often you should have your eyes and teeth checked.  Stay up to date on all vaccines. This information is not intended to replace advice given to you by your health care provider. Make sure you discuss any questions you have with your health care provider. Document Released: 06/24/2015 Document Revised: 05/22/2018 Document Reviewed: 05/22/2018 Elsevier Patient Education  2020 [employer].

## 2019-04-09 NOTE — Assessment & Plan Note (Signed)
Continue Lunesta as needed Taking very sparingly

## 2019-04-10 LAB — COMPREHENSIVE METABOLIC PANEL
ALT: 30 IU/L (ref 0–44)
AST: 19 IU/L (ref 0–40)
Albumin/Globulin Ratio: 2.4 — ABNORMAL HIGH (ref 1.2–2.2)
Albumin: 4.3 g/dL (ref 4.0–5.0)
Alkaline Phosphatase: 70 IU/L (ref 39–117)
BUN/Creatinine Ratio: 12 (ref 9–20)
BUN: 10 mg/dL (ref 6–24)
Bilirubin Total: 0.5 mg/dL (ref 0.0–1.2)
CO2: 21 mmol/L (ref 20–29)
Calcium: 7.5 mg/dL — ABNORMAL LOW (ref 8.7–10.2)
Chloride: 102 mmol/L (ref 96–106)
Creatinine, Ser: 0.83 mg/dL (ref 0.76–1.27)
GFR calc Af Amer: 124 mL/min/{1.73_m2} (ref >59)
GFR calc non Af Amer: 107 mL/min/{1.73_m2} (ref >59)
Globulin, Total: 1.8 g/dL (ref 1.5–4.5)
Glucose: 91 mg/dL (ref 65–99)
Potassium: 4 mmol/L (ref 3.5–5.2)
Sodium: 140 mmol/L (ref 134–144)
Total Protein: 6.1 g/dL (ref 6.0–8.5)

## 2019-04-10 LAB — VITAMIN D 25 HYDROXY (VIT D DEFICIENCY, FRACTURES): Vit D, 25-Hydroxy: 21.3 ng/mL — ABNORMAL LOW (ref 30.0–100.0)

## 2019-04-10 LAB — CBC WITH DIFFERENTIAL/PLATELET
Basophils Absolute: 0 10*3/uL (ref 0.0–0.2)
Basos: 0 %
EOS (ABSOLUTE): 0.2 10*3/uL (ref 0.0–0.4)
Eos: 2 %
Hematocrit: 40.8 % (ref 37.5–51.0)
Hemoglobin: 14.4 g/dL (ref 13.0–17.7)
Immature Grans (Abs): 0 10*3/uL (ref 0.0–0.1)
Immature Granulocytes: 1 %
Lymphocytes Absolute: 1.8 10*3/uL (ref 0.7–3.1)
Lymphs: 28 %
MCH: 31.4 pg (ref 26.6–33.0)
MCHC: 35.3 g/dL (ref 31.5–35.7)
MCV: 89 fL (ref 79–97)
Monocytes Absolute: 0.5 10*3/uL (ref 0.1–0.9)
Monocytes: 8 %
Neutrophils Absolute: 3.9 10*3/uL (ref 1.4–7.0)
Neutrophils: 61 %
Platelets: 197 10*3/uL (ref 150–450)
RBC: 4.59 x10E6/uL (ref 4.14–5.80)
RDW: 11.8 % (ref 11.6–15.4)
WBC: 6.3 10*3/uL (ref 3.4–10.8)

## 2019-04-10 LAB — LIPID PANEL WITH LDL/HDL RATIO
Cholesterol, Total: 207 mg/dL — ABNORMAL HIGH (ref 100–199)
HDL: 38 mg/dL — ABNORMAL LOW (ref >39)
LDL Chol Calc (NIH): 114 mg/dL — ABNORMAL HIGH (ref 0–99)
LDL/HDL Ratio: 3 ratio (ref 0.0–3.6)
Triglycerides: 317 mg/dL — ABNORMAL HIGH (ref 0–149)
VLDL Cholesterol Cal: 55 mg/dL — ABNORMAL HIGH (ref 5–40)

## 2019-04-10 LAB — PSA: Prostate Specific Ag, Serum: 2.4 ng/mL (ref 0.0–4.0)

## 2019-04-10 LAB — HEMOGLOBIN A1C
Est. average glucose Bld gHb Est-mCnc: 120 mg/dL
Hgb A1c MFr Bld: 5.8 % — ABNORMAL HIGH (ref 4.8–5.6)

## 2019-04-10 LAB — TSH: TSH: 2.64 u[IU]/mL (ref 0.450–4.500)

## 2019-04-13 ENCOUNTER — Telehealth: Payer: Self-pay

## 2019-04-13 DIAGNOSIS — R972 Elevated prostate specific antigen [PSA]: Secondary | ICD-10-CM

## 2019-04-13 NOTE — Telephone Encounter (Signed)
-----   Message from Virginia Crews, MD sent at 04/10/2019  4:42 PM EDT ----- Normal Blood counts, kidney function, liver function, thyroid function.  Cholesterol remains slightly elevated and stable. I recommend diet low in saturated fat and regular exercise - 30 min at least 5 times per week.  PSA has nearly doubled since last year.  Is he having any prostate symptoms?  It is still in the normal range, but would recommend seeing urology.  Hemoglobin A1c, 58-month average of blood sugars, remains in the prediabetic range.  Recommend regular exercise and low-carb diet.  Calcium is low, which is likely related to vitamin D deficiency.  Recommend that he starts taking calcium and vitamin D supplement.  Recommend vitamin D3 1000 to 2000 units daily.

## 2019-04-13 NOTE — Telephone Encounter (Signed)
Tried calling; pt's mailbox is full.   Thanks,   -Laura  

## 2019-04-14 NOTE — Telephone Encounter (Signed)
Patient advised as below. Patient denies any symptoms related to prostate. Patient agreed to urology referral.

## 2019-04-14 NOTE — Telephone Encounter (Signed)
Left message on [phone]  Thanks,   -Mickel Baas

## 2019-04-15 NOTE — Telephone Encounter (Signed)
OK to place referral to Urology for elevated PSA

## 2019-05-26 ENCOUNTER — Ambulatory Visit: Payer: Managed Care, Other (non HMO) | Admitting: Urology

## 2019-05-28 ENCOUNTER — Other Ambulatory Visit: Payer: Self-pay | Admitting: Family Medicine

## 2019-05-28 NOTE — Telephone Encounter (Signed)
Requested medication (s) are due for refill today: yes  Requested medication (s) are on the active medication list: yes  Last refill:  03/14/2019  Future visit scheduled: yes  Notes to clinic:  50,000 IU strengths are not delegated   Requested Prescriptions  Pending Prescriptions Disp Refills   Vitamin D, Ergocalciferol, (DRISDOL) 1.25 MG (50000 UT) CAPS capsule [Pharmacy Med Name: VITAMIN D2 1.25MG (50,000 UNIT)] 12 capsule 3    Sig: TAKE 1 CAPSULE BY MOUTH ONE TIME PER WEEK      Endocrinology:  Vitamins - Vitamin D Supplementation Failed - 05/28/2019  3:54 AM      Failed - 50,000 IU strengths are not delegated      Failed - Ca in normal range and within 360 days    Calcium  Date Value Ref Range Status  04/09/2019 7.5 (L) 8.7 - 10.2 mg/dL Final          Failed - Phosphate in normal range and within 360 days    Phosphorus  Date Value Ref Range Status  03/27/2017 3.6 2.5 - 4.5 mg/dL Final          Failed - Vitamin D in normal range and within 360 days    Vit D, 25-Hydroxy  Date Value Ref Range Status  04/09/2019 21.3 (L) 30.0 - 100.0 ng/mL Final    Comment:    Vitamin D deficiency has been defined by the Institute of Medicine and an Endocrine Society practice guideline as a level of serum 25-OH vitamin D less than 20 ng/mL (1,2). The Endocrine Society went on to further define vitamin D insufficiency as a level between 21 and 29 ng/mL (2). 1. IOM (Institute of Medicine). 2010. Dietary reference    intakes for calcium and D. [place]: The    [employer]. 2. Holick MF, Binkley [place], Bischoff-Ferrari HA, et al.    Evaluation, treatment, and prevention of vitamin D    deficiency: an Endocrine Society clinical practice    guideline. JCEM. 2011 Jul; 96(7):1911-30.           Passed - Valid encounter within last 12 months    Recent Outpatient Visits           1 month ago Encounter for annual physical exam   [facility], Dionne Bucy, MD   10 months ago Hypertriglyceridemia   [facility], Dionne Bucy, MD   1 year ago Encounter for annual physical exam   [facility], Dionne Bucy, MD       Future Appointments             In 3 weeks Hollice Espy, MD [place]

## 2019-06-02 ENCOUNTER — Ambulatory Visit: Payer: Managed Care, Other (non HMO) | Admitting: Urology

## 2019-06-24 ENCOUNTER — Encounter: Payer: Self-pay | Admitting: Urology

## 2019-06-24 ENCOUNTER — Ambulatory Visit (INDEPENDENT_AMBULATORY_CARE_PROVIDER_SITE_OTHER): Payer: Managed Care, Other (non HMO) | Admitting: Urology

## 2019-06-24 ENCOUNTER — Other Ambulatory Visit: Payer: Self-pay

## 2019-06-24 VITALS — BP 171/102 | HR 67 | Ht 67.0 in | Wt 200.0 lb

## 2019-06-24 DIAGNOSIS — R972 Elevated prostate specific antigen [PSA]: Secondary | ICD-10-CM | POA: Diagnosis not present

## 2019-06-24 DIAGNOSIS — R351 Nocturia: Secondary | ICD-10-CM | POA: Diagnosis not present

## 2019-06-24 NOTE — Progress Notes (Signed)
06/24/2019 5:04 PM   Keith Cohen [birth year] [id number]  Referring provider: Virginia Crews, MD [address] [place],  [place] [address]  Chief Complaint  Patient presents with  . Elevated PSA    HPI: 44 year old male who presents today for further evaluation of rising PSA.  He was noted to have a PSA of 1.3 in 03/2018.  This was rechecked again this year by his primary care physician on 2019-04-09 at which time his PSA was noted to be 2.4.  This fairly significant rise to today's visit.  Although documented by his primary care physician, he does not in fact have a family history of prostate cancer.  He denies any significant urinary symptoms.  He does get up once or twice at night to void but does not bother him.  He does not watch what he drinks before he goes to bed.  No weight loss or bone pain.  He is very fit and works out.  He uses over-the-counter supplements that he buys from a nutrition shop.  He was taking whey protein but more recently switched over to capsules.  He cannot recall the name of it.  He denies any testosterone use.  He does report that he ejaculated this morning before the appointment.  PMH: No past medical history on file.  Surgical History: Past Surgical History:  Procedure Laterality Date  . WISDOM TOOTH EXTRACTION  2003    Home Medications:  Allergies as of 06/24/2019   No Known Allergies     Medication List       Accurate as of June 24, 2019  5:04 PM. If you have any questions, ask your nurse or doctor.        COD LIVER OIL PO Take by mouth as needed.   eszopiclone 2 MG Tabs tablet Commonly known as: LUNESTA Take 1 tablet (2 mg total) by mouth at bedtime as needed for sleep. Take immediately before bedtime   multivitamin capsule Take 1 capsule by mouth daily.   Vitamin D (Ergocalciferol) 1.25 MG (50000 UNIT) Caps capsule Commonly known as: DRISDOL TAKE 1 CAPSULE BY MOUTH ONE TIME PER WEEK       Allergies: No Known Allergies  Family History: Family History  Problem Relation Age of Onset  . Diabetes Mother   . Healthy Father   . Diabetes Paternal Grandfather   . Prostate cancer Paternal Uncle   . Colon cancer Neg Hx   . Breast cancer Neg Hx     Social History:  reports that he has been smoking cigarettes. He has a 3.75 pack-year smoking history. He has quit using smokeless tobacco.  His smokeless tobacco use included chew. He reports current alcohol use of about 10.0 standard drinks of alcohol per week. He reports that he does not use drugs.  ROS: UROLOGY Frequent Urination?: Yes Hard to postpone urination?: No Burning/pain with urination?: No Get up at night to urinate?: Yes Leakage of urine?: No Urine stream starts and stops?: No Trouble starting stream?: No Do you have to strain to urinate?: No Blood in urine?: No Urinary tract infection?: No Sexually transmitted disease?: No Injury to kidneys or bladder?: No Painful intercourse?: No Weak stream?: No Erection problems?: No Penile pain?: No  Gastrointestinal Nausea?: No Vomiting?: No Indigestion/heartburn?: No Diarrhea?: No Constipation?: No  Constitutional Fever: No Night sweats?: No Weight loss?: No Fatigue?: No  Skin Skin rash/lesions?: No Itching?: No  Eyes Blurred vision?: No Double vision?: No  Ears/Nose/Throat Sore throat?:  No Sinus problems?: No  Hematologic/Lymphatic Swollen glands?: No Easy bruising?: No  Cardiovascular Leg swelling?: No Chest pain?: No  Respiratory Cough?: No Shortness of breath?: No  Endocrine Excessive thirst?: No  Musculoskeletal Back pain?: No Joint pain?: No  Neurological Headaches?: No Dizziness?: No  Psychologic Depression?: No Anxiety?: No  Physical Exam: BP (!) 171/102   Pulse 67   Ht 5\' 7"  (1.702 m)   Wt 200 lb (90.7 kg)   BMI 31.32 kg/m   Constitutional:  Alert and oriented, No acute distress. HEENT: [place] AT, moist mucus  membranes.  Trachea midline, no masses. Cardiovascular: No clubbing, cyanosis, or edema. Respiratory: Normal respiratory effort, no increased work of breathing. GI: Abdomen is soft, nontender, nondistended, no abdominal masses Rectal: 30 cc prostate, nontender, no nodules. Skin: No rashes, bruises or suspicious lesions. Neurologic: Grossly intact, no focal deficits, moving all 4 extremities. Psychiatric: Normal mood and affect.  Laboratory Data: Lab Results  Component Value Date   WBC 6.3 04/09/2019   HGB 14.4 04/09/2019   HCT 40.8 04/09/2019   MCV 89 04/09/2019   PLT 197 04/09/2019    Lab Results  Component Value Date   CREATININE 0.83 04/09/2019   PSA as above  Lab Results  Component Value Date   HGBA1C 5.8 (H) 04/09/2019    Urinalysis UA today reviewed, completely negative.  See epic.   Assessment & Plan:    1. Rising PSA level We discussed PSA screening guidelines.  Generally, we do not recommend PSA screening lessers a family history of prostate cancer at his age.  He denies this.  His PSA has risen greater than a generally excepted cutoffs of 0.75 ng/dL over the past year thus does raise flags.   We reviewed the implications of an elevated PSA and the uncertainty surrounding it. In general, a man's PSA increases with age and is produced by both normal and cancerous prostate tissue. Differential for elevated PSA is BPH, prostate cancer, infection, recent intercourse/ejaculation, prostate infarction, recent urethroscopic manipulation (foley placement/cystoscopy) and prostatitis. Management of an elevated PSA can include observation or prostate biopsy and wediscussed this in detail.  Plan to repeat his PSA today.  We will call him tomorrow with these results.  If his PSA goes back down to his previous value, he can be followed up by his primary care.  If his PSA continues to rise or does not trend back down, will likely follow him closely.  He is agreeable this plan.   He also mention today that he may want to try to have his PSA drawn 1 additional time with abstinence for 3 days prior to blood draw to see if that makes a difference.  Will assess the need for that based on his labs which are pending today.  All questions answered. - PSA  2. Nocturia Behavioral modification discussed  Overall minimal bother  Urine today is unremarkable - Urinalysis, Complete   Return for will call with PSA results and recommendations.  Hollice Espy, MD  [facility] [address], [place], [place] [address] [phone]

## 2019-06-25 LAB — URINALYSIS, COMPLETE
Bilirubin, UA: NEGATIVE
Glucose, UA: NEGATIVE
Ketones, UA: NEGATIVE
Leukocytes,UA: NEGATIVE
Nitrite, UA: NEGATIVE
Specific Gravity, UA: 1.02 (ref 1.005–1.030)
Urobilinogen, Ur: 1 mg/dL (ref 0.2–1.0)
pH, UA: 7.5 (ref 5.0–7.5)

## 2019-06-25 LAB — MICROSCOPIC EXAMINATION: Bacteria, UA: NONE SEEN

## 2019-06-25 LAB — PSA: Prostate Specific Ag, Serum: 1.7 ng/mL (ref 0.0–4.0)

## 2019-07-07 ENCOUNTER — Other Ambulatory Visit: Payer: Self-pay | Admitting: Family Medicine

## 2019-07-07 NOTE — Telephone Encounter (Signed)
eszopiclone (LUNESTA) 2 MG TABS tablet     Patient is requesting a refill.    Pharmacy:  [id number] - [place], [place] - [address] Phone:  [phone]  Fax:  [phone]

## 2019-07-08 MED ORDER — ESZOPICLONE 2 MG PO TABS: 2.0000 mg | ORAL_TABLET | Freq: Every evening | ORAL | 3 refills | Status: DC | PRN

## 2020-02-16 ENCOUNTER — Other Ambulatory Visit: Payer: Self-pay | Admitting: Family Medicine

## 2020-02-16 NOTE — Telephone Encounter (Signed)
Requested medication (s) are due for refill today - yes  Requested medication (s) are on the active medication list -yes  Future visit scheduled - yes  Last refill: 07/08/19 #30 3 RF  Notes to clinic: request for non delegated Rx  Requested Prescriptions  Pending Prescriptions Disp Refills   eszopiclone (LUNESTA) 2 MG TABS tablet [Pharmacy Med Name: ESZOPICLONE 2 MG TABLET] 30 tablet     Sig: Take 1 tablet (2 mg total) by mouth at bedtime as needed for sleep. Take immediately before bedtime      Not Delegated - Psychiatry:  Anxiolytics/Hypnotics Failed - 02/16/2020 12:22 PM      Failed - This refill cannot be delegated      Failed - Urine Drug Screen completed in last 360 days.      Failed - Valid encounter within last 6 months    Recent Outpatient Visits           10 months ago Encounter for annual physical exam   [facility] [place], Dionne Bucy, MD   1 year ago Hypertriglyceridemia   [facility] [place], Dionne Bucy, MD   1 year ago Encounter for annual physical exam   [facility] [place], Dionne Bucy, MD       Future Appointments             In 1 month Bacigalupo, Dionne Bucy, MD [facility]                Requested Prescriptions  Pending Prescriptions Disp Refills   eszopiclone (LUNESTA) 2 MG TABS tablet [Pharmacy Med Name: ESZOPICLONE 2 MG TABLET] 30 tablet     Sig: Take 1 tablet (2 mg total) by mouth at bedtime as needed for sleep. Take immediately before bedtime      Not Delegated - Psychiatry:  Anxiolytics/Hypnotics Failed - 02/16/2020 12:22 PM      Failed - This refill cannot be delegated      Failed - Urine Drug Screen completed in last 360 days.      Failed - Valid encounter within last 6 months    Recent Outpatient Visits           10 months ago Encounter for annual physical exam   [facility] [place], Dionne Bucy, MD   1 year ago Hypertriglyceridemia   [facility] [place], Dionne Bucy, MD   1 year ago Encounter for annual physical exam   [facility] [place], Dionne Bucy, MD       Future Appointments             In 1 month Bacigalupo, Dionne Bucy, MD [facility] [place]

## 2020-03-08 ENCOUNTER — Encounter: Payer: Self-pay | Admitting: Physician Assistant

## 2020-03-08 ENCOUNTER — Other Ambulatory Visit: Payer: Self-pay

## 2020-03-08 ENCOUNTER — Ambulatory Visit: Payer: Managed Care, Other (non HMO) | Admitting: Physician Assistant

## 2020-03-08 VITALS — BP 148/98 | HR 83 | Temp 97.8°F | Resp 18 | Ht 68.0 in | Wt 187.0 lb

## 2020-03-08 DIAGNOSIS — Z0000 Encounter for general adult medical examination without abnormal findings: Secondary | ICD-10-CM | POA: Diagnosis not present

## 2020-03-08 DIAGNOSIS — Z008 Encounter for other general examination: Secondary | ICD-10-CM

## 2020-03-08 NOTE — Patient Instructions (Signed)
Low-Sodium Eating Plan Sodium, which is an element that makes up salt, helps you maintain a healthy balance of fluids in your body. Too much sodium can increase your blood pressure and cause fluid and waste to be held in your body. Your health care provider or dietitian may recommend following this plan if you have high blood pressure (hypertension), kidney disease, liver disease, or heart failure. Eating less sodium can help lower your blood pressure, reduce swelling, and protect your heart, liver, and kidneys. What are tips for following this plan? General guidelines  Most people on this plan should limit their sodium intake to 1,500-2,000 mg (milligrams) of sodium each day. Reading food labels   The Nutrition Facts label lists the amount of sodium in one serving of the food. If you eat more than one serving, you must multiply the listed amount of sodium by the number of servings.  Choose foods with less than 140 mg of sodium per serving.  Avoid foods with 300 mg of sodium or more per serving. Shopping  Look for lower-sodium products, often labeled as "low-sodium" or "no salt added."  Always check the sodium content even if foods are labeled as "unsalted" or "no salt added".  Buy fresh foods. ? Avoid canned foods and premade or frozen meals. ? Avoid canned, cured, or processed meats  Buy breads that have less than 80 mg of sodium per slice. Cooking  Eat more home-cooked food and less restaurant, buffet, and fast food.  Avoid adding salt when cooking. Use salt-free seasonings or herbs instead of table salt or sea salt. Check with your health care provider or pharmacist before using salt substitutes.  Cook with plant-based oils, such as canola, sunflower, or olive oil. Meal planning  When eating at a restaurant, ask that your food be prepared with less salt or no salt, if possible.  Avoid foods that contain MSG (monosodium glutamate). MSG is sometimes added to Chinese food,  bouillon, and some canned foods. What foods are recommended? The items listed may not be a complete list. Talk with your dietitian about what dietary choices are best for you. Grains Low-sodium cereals, including oats, puffed wheat and rice, and shredded wheat. Low-sodium crackers. Unsalted rice. Unsalted pasta. Low-sodium bread. Whole-grain breads and whole-grain pasta. Vegetables Fresh or frozen vegetables. "No salt added" canned vegetables. "No salt added" tomato sauce and paste. Low-sodium or reduced-sodium tomato and vegetable juice. Fruits Fresh, frozen, or canned fruit. Fruit juice. Meats and other protein foods Fresh or frozen (no salt added) meat, poultry, seafood, and fish. Low-sodium canned tuna and salmon. Unsalted nuts. Dried peas, beans, and lentils without added salt. Unsalted canned beans. Eggs. Unsalted nut butters. Dairy Milk. Soy milk. Cheese that is naturally low in sodium, such as ricotta cheese, fresh mozzarella, or Swiss cheese Low-sodium or reduced-sodium cheese. Cream cheese. Yogurt. Fats and oils Unsalted butter. Unsalted margarine with no trans fat. Vegetable oils such as canola or olive oils. Seasonings and other foods Fresh and dried herbs and spices. Salt-free seasonings. Low-sodium mustard and ketchup. Sodium-free salad dressing. Sodium-free light mayonnaise. Fresh or refrigerated horseradish. Lemon juice. Vinegar. Homemade, reduced-sodium, or low-sodium soups. Unsalted popcorn and pretzels. Low-salt or salt-free chips. What foods are not recommended? The items listed may not be a complete list. Talk with your dietitian about what dietary choices are best for you. Grains Instant hot cereals. Bread stuffing, pancake, and biscuit mixes. Croutons. Seasoned rice or pasta mixes. Noodle soup cups. Boxed or frozen macaroni and cheese. Regular salted   crackers. Self-rising flour. Vegetables Sauerkraut, pickled vegetables, and relishes. Olives. [place] fries. Onion rings.  Regular canned vegetables (not low-sodium or reduced-sodium). Regular canned tomato sauce and paste (not low-sodium or reduced-sodium). Regular tomato and vegetable juice (not low-sodium or reduced-sodium). Frozen vegetables in sauces. Meats and other protein foods Meat or fish that is salted, canned, smoked, spiced, or pickled. Bacon, ham, sausage, hotdogs, corned beef, chipped beef, packaged lunch meats, salt pork, jerky, pickled herring, anchovies, regular canned tuna, sardines, salted nuts. Dairy Processed cheese and cheese spreads. Cheese curds. Blue cheese. Feta cheese. String cheese. Regular cottage cheese. Buttermilk. Canned milk. Fats and oils Salted butter. Regular margarine. Ghee. Bacon fat. Seasonings and other foods Onion salt, garlic salt, seasoned salt, table salt, and sea salt. Canned and packaged gravies. Worcestershire sauce. Tartar sauce. Barbecue sauce. Teriyaki sauce. Soy sauce, including reduced-sodium. Steak sauce. Fish sauce. Oyster sauce. Cocktail sauce. Horseradish that you find on the shelf. Regular ketchup and mustard. Meat flavorings and tenderizers. Bouillon cubes. Hot sauce and Tabasco sauce. Premade or packaged marinades. Premade or packaged taco seasonings. Relishes. Regular salad dressings. Salsa. Potato and tortilla chips. Corn chips and puffs. Salted popcorn and pretzels. Canned or dried soups. Pizza. Frozen entrees and pot pies. Summary  Eating less sodium can help lower your blood pressure, reduce swelling, and protect your heart, liver, and kidneys.  Most people on this plan should limit their sodium intake to 1,500-2,000 mg (milligrams) of sodium each day.  Canned, boxed, and frozen foods are high in sodium. Restaurant foods, fast foods, and pizza are also very high in sodium. You also get sodium by adding salt to food.  Try to cook at home, eat more fresh fruits and vegetables, and eat less fast food, canned, processed, or prepared foods. This information is  not intended to replace advice given to you by your health care provider. Make sure you discuss any questions you have with your health care provider. Document Revised: 05/10/2017 Document Reviewed: 05/21/2016 Elsevier Patient Education  [place] DASH stands for "Dietary Approaches to Stop Hypertension." The DASH eating plan is a healthy eating plan that has been shown to reduce high blood pressure (hypertension). It may also reduce your risk for type 2 diabetes, heart disease, and stroke. The DASH eating plan may also help with weight loss. What are tips for following this plan?  General guidelines  Avoid eating more than 2,300 mg (milligrams) of salt (sodium) a day. If you have hypertension, you may need to reduce your sodium intake to 1,500 mg a day.  Limit alcohol intake to no more than 1 drink a day for nonpregnant women and 2 drinks a day for men. One drink equals 12 oz of beer, 5 oz of wine, or 1 oz of hard liquor.  Work with your health care provider to maintain a healthy body weight or to lose weight. Ask what an ideal weight is for you.  Get at least 30 minutes of exercise that causes your heart to beat faster (aerobic exercise) most days of the week. Activities may include walking, swimming, or biking.  Work with your health care provider or diet and nutrition specialist (dietitian) to adjust your eating plan to your individual calorie needs. Reading food labels   Check food labels for the amount of sodium per serving. Choose foods with less than 5 percent of the Daily Value of sodium. Generally, foods with less than 300 mg of sodium per serving fit into this eating plan.  To find whole grains, look for the word "whole" as the first word in the ingredient list. Shopping  Buy products labeled as "low-sodium" or "no salt added."  Buy fresh foods. Avoid canned foods and premade or frozen meals. Cooking  Avoid adding salt when cooking. Use salt-free  seasonings or herbs instead of table salt or sea salt. Check with your health care provider or pharmacist before using salt substitutes.  Do not fry foods. Cook foods using healthy methods such as baking, boiling, grilling, and broiling instead.  Cook with heart-healthy oils, such as olive, canola, soybean, or sunflower oil. Meal planning  Eat a balanced diet that includes: ? 5 or more servings of fruits and vegetables each day. At each meal, try to fill half of your plate with fruits and vegetables. ? Up to 6-8 servings of whole grains each day. ? Less than 6 oz of lean meat, poultry, or fish each day. A 3-oz serving of meat is about the same size as a deck of cards. One egg equals 1 oz. ? 2 servings of low-fat dairy each day. ? A serving of nuts, seeds, or beans 5 times each week. ? Heart-healthy fats. Healthy fats called Omega-3 fatty acids are found in foods such as flaxseeds and coldwater fish, like sardines, salmon, and mackerel.  Limit how much you eat of the following: ? Canned or prepackaged foods. ? Food that is high in trans fat, such as fried foods. ? Food that is high in saturated fat, such as fatty meat. ? Sweets, desserts, sugary drinks, and other foods with added sugar. ? Full-fat dairy products.  Do not salt foods before eating.  Try to eat at least 2 vegetarian meals each week.  Eat more home-cooked food and less restaurant, buffet, and fast food.  When eating at a restaurant, ask that your food be prepared with less salt or no salt, if possible. What foods are recommended? The items listed may not be a complete list. Talk with your dietitian about what dietary choices are best for you. Grains Whole-grain or whole-wheat bread. Whole-grain or whole-wheat pasta. Brown rice. [place]. Bulgur. Whole-grain and low-sodium cereals. Pita bread. Low-fat, low-sodium crackers. Whole-wheat flour tortillas. Vegetables Fresh or frozen vegetables (raw, steamed, roasted, or  grilled). Low-sodium or reduced-sodium tomato and vegetable juice. Low-sodium or reduced-sodium tomato sauce and tomato paste. Low-sodium or reduced-sodium canned vegetables. Fruits All fresh, dried, or frozen fruit. Canned fruit in natural juice (without added sugar). Meat and other protein foods Skinless chicken or [place]. Ground chicken or [place]. Pork with fat trimmed off. Fish and seafood. Egg whites. Dried beans, peas, or lentils. Unsalted nuts, nut butters, and seeds. Unsalted canned beans. Lean cuts of beef with fat trimmed off. Low-sodium, lean deli meat. Dairy Low-fat (1%) or fat-free (skim) milk. Fat-free, low-fat, or reduced-fat cheeses. Nonfat, low-sodium ricotta or cottage cheese. Low-fat or nonfat yogurt. Low-fat, low-sodium cheese. Fats and oils Soft margarine without trans fats. Vegetable oil. Low-fat, reduced-fat, or light mayonnaise and salad dressings (reduced-sodium). Canola, safflower, olive, soybean, and sunflower oils. Avocado. Seasoning and other foods Herbs. Spices. Seasoning mixes without salt. Unsalted popcorn and pretzels. Fat-free sweets. What foods are not recommended? The items listed may not be a complete list. Talk with your dietitian about what dietary choices are best for you. Grains Baked goods made with fat, such as croissants, muffins, or some breads. Dry pasta or rice meal packs. Vegetables Creamed or fried vegetables. Vegetables in a cheese sauce. Regular canned vegetables (not  low-sodium or reduced-sodium). Regular canned tomato sauce and paste (not low-sodium or reduced-sodium). Regular tomato and vegetable juice (not low-sodium or reduced-sodium). Angie Fava. Olives. Fruits Canned fruit in a light or heavy syrup. Fried fruit. Fruit in cream or butter sauce. Meat and other protein foods Fatty cuts of meat. Ribs. Fried meat. Berniece Salines. Sausage. Bologna and other processed lunch meats. Salami. Fatback. Hotdogs. Bratwurst. Salted nuts and seeds. Canned beans with  added salt. Canned or smoked fish. Whole eggs or egg yolks. Chicken or [place] with skin. Dairy Whole or 2% milk, cream, and half-and-half. Whole or full-fat cream cheese. Whole-fat or sweetened yogurt. Full-fat cheese. Nondairy creamers. Whipped toppings. Processed cheese and cheese spreads. Fats and oils Butter. Stick margarine. Lard. Shortening. Ghee. Bacon fat. Tropical oils, such as coconut, palm kernel, or palm oil. Seasoning and other foods Salted popcorn and pretzels. Onion salt, garlic salt, seasoned salt, table salt, and sea salt. Worcestershire sauce. Tartar sauce. Barbecue sauce. Teriyaki sauce. Soy sauce, including reduced-sodium. Steak sauce. Canned and packaged gravies. Fish sauce. Oyster sauce. Cocktail sauce. Horseradish that you find on the shelf. Ketchup. Mustard. Meat flavorings and tenderizers. Bouillon cubes. Hot sauce and Tabasco sauce. Premade or packaged marinades. Premade or packaged taco seasonings. Relishes. Regular salad dressings. Where to find more information:  National Heart, Lung, and [place]: https://wilson-eaton.com/  American Heart Association: www.heart.org Summary  The DASH eating plan is a healthy eating plan that has been shown to reduce high blood pressure (hypertension). It may also reduce your risk for type 2 diabetes, heart disease, and stroke.  With the DASH eating plan, you should limit salt (sodium) intake to 2,300 mg a day. If you have hypertension, you may need to reduce your sodium intake to 1,500 mg a day.  When on the DASH eating plan, aim to eat more fresh fruits and vegetables, whole grains, lean proteins, low-fat dairy, and heart-healthy fats.  Work with your health care provider or diet and nutrition specialist (dietitian) to adjust your eating plan to your individual calorie needs. This information is not intended to replace advice given to you by your health care provider. Make sure you discuss any questions you have with your health care  provider. Document Revised: 05/10/2017 Document Reviewed: 05/21/2016 Elsevier Patient Education  2020 [employer].

## 2020-03-08 NOTE — Progress Notes (Signed)
Subjective:    Patient ID: Keith Cohen, male    DOB: 28-Jan-1976, 44 y.o.   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 Has been noted before  Drinks 64 oz soft drinks daily, 64 ounces water, ETOH below Has not restricted dietary salt before Cigarettes discussed  Hx HTN - not medicated Hx Vit D deficiency - Drisdol 1.25 mg (50,000 units) 1 weekly  Lunesta 1 HS as needed for sleep. Only takes now and then  PCP [name] "only on occasion with drink"- But have noted that alcohol is 24 oz beer QHS and twice that plus mixed drinks on weekend evenings.   Encouraged him to decrease or D/C cigarettes particularly w HTN and also increased irritation to lungs with respiratory pandemic and potential increased exposure. Smoke x 15 years.  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 We discussed increased risk population he commonly comes in contact with and the close space of interior ambulance for their transport trips. He does wear a mask and commonly a face shield but has been resistant to the vaccine.  Review of Systems Recently attended wedding party 02/27/20  with approximately 100 people. Masks and hand sanitizer were required- but no reference to vaccination status made.  Denies any symptoms. Encouraged Covid testing- Encourage Covid vaccine     Objective:   Physical Exam Vitals reviewed.  Constitutional:      General: He is not in acute distress.    Appearance: Normal appearance.     Comments: 148/98       28.43  BMI     187 lbs  Denies headache, chest pain , SOB or any other symptoms at this time  HENT:     Head: Normocephalic and atraumatic.     Right Ear: Tympanic membrane and external ear normal.      Left Ear: Tympanic membrane and external ear normal.     Ears:     Comments: Bilateral canals with non-obstructing cerumen    Nose: Nose normal.     Mouth/Throat:     Mouth: Mucous membranes are moist.     Comments: DDS q 6 months, good repair Eyes:     Extraocular Movements: Extraocular movements intact.     Conjunctiva/sclera: Conjunctivae normal.  Cardiovascular:     Rate and Rhythm: Normal rate and regular rhythm.     Pulses: Normal pulses.     Heart sounds: Normal heart sounds.  Pulmonary:     Effort: Pulmonary effort is normal.     Breath sounds: Normal breath sounds.  Abdominal:     General: Bowel sounds are normal.     Palpations: Abdomen is soft. There is no mass.     Tenderness: There is no abdominal tenderness.  Genitourinary:    Comments: Denies concerns, exam deferred Musculoskeletal:        General: Normal range of motion.     Cervical back: Normal range of motion and neck supple.     Right lower leg: No edema.     Left lower leg: No edema.  Lymphadenopathy:     Cervical: No cervical adenopathy.  Skin:    General: Skin is warm.     Capillary Refill: Capillary  refill takes less than 2 seconds.  Neurological:     General: No focal deficit present.     Mental Status: He is alert.     Cranial Nerves: No cranial nerve deficit.     Deep Tendon Reflexes: Reflexes normal.  Psychiatric:        Mood and Affect: Mood normal.        Behavior: Behavior normal.       Assessment & Plan:  Printed DASH diet and Low Sodium diet to go  Extended discussion and recommendations Dietary changes encouraged  He is scheduled to see PCP on Friday Oct 1 for BP eval  Extended conversation about Covid vaccine effectiveness, protection generally yields less severe disease if exposed  Potential impact on Delta  Labs to be reported as available  Reports My Chart active

## 2020-03-09 LAB — LIPID PANEL
Chol/HDL Ratio: 5.3 ratio — ABNORMAL HIGH (ref 0.0–5.0)
Cholesterol, Total: 251 mg/dL — ABNORMAL HIGH (ref 100–199)
HDL: 47 mg/dL (ref >39)
LDL Chol Calc (NIH): 170 mg/dL — ABNORMAL HIGH (ref 0–99)
Triglycerides: 183 mg/dL — ABNORMAL HIGH (ref 0–149)
VLDL Cholesterol Cal: 34 mg/dL (ref 5–40)

## 2020-03-09 LAB — GLUCOSE, RANDOM: Glucose: 90 mg/dL (ref 65–99)

## 2020-03-11 ENCOUNTER — Other Ambulatory Visit: Payer: Self-pay

## 2020-03-11 ENCOUNTER — Encounter: Payer: Self-pay | Admitting: Family Medicine

## 2020-03-11 ENCOUNTER — Ambulatory Visit (INDEPENDENT_AMBULATORY_CARE_PROVIDER_SITE_OTHER): Payer: Managed Care, Other (non HMO) | Admitting: Family Medicine

## 2020-03-11 VITALS — BP 134/92 | HR 84 | Temp 98.3°F | Wt 194.0 lb

## 2020-03-11 DIAGNOSIS — I10 Essential (primary) hypertension: Secondary | ICD-10-CM | POA: Diagnosis not present

## 2020-03-11 DIAGNOSIS — E1159 Type 2 diabetes mellitus with other circulatory complications: Secondary | ICD-10-CM | POA: Insufficient documentation

## 2020-03-11 DIAGNOSIS — E782 Mixed hyperlipidemia: Secondary | ICD-10-CM

## 2020-03-11 DIAGNOSIS — R7303 Prediabetes: Secondary | ICD-10-CM | POA: Diagnosis not present

## 2020-03-11 MED ORDER — HYDROCHLOROTHIAZIDE 12.5 MG PO TABS: 12.5000 mg | ORAL_TABLET | Freq: Every day | ORAL | 30 refills | Status: DC

## 2020-03-11 NOTE — Assessment & Plan Note (Signed)
New diagnosis Uncontrolled Discussed lifestyle management Start HCTZ 12.5 mg daily Check BMP F/u in 1 month

## 2020-03-11 NOTE — Assessment & Plan Note (Signed)
Encourage low carb diet Recheck A1c 

## 2020-03-11 NOTE — Patient Instructions (Signed)
DASH Eating Plan DASH stands for "Dietary Approaches to Stop Hypertension." The DASH eating plan is a healthy eating plan that has been shown to reduce high blood pressure (hypertension). It may also reduce your risk for type 2 diabetes, heart disease, and stroke. The DASH eating plan may also help with weight loss. What are tips for following this plan?  General guidelines  Avoid eating more than 2,300 mg (milligrams) of salt (sodium) a day. If you have hypertension, you may need to reduce your sodium intake to 1,500 mg a day.  Limit alcohol intake to no more than 1 drink a day for nonpregnant women and 2 drinks a day for men. One drink equals 12 oz of beer, 5 oz of wine, or 1 oz of hard liquor.  Work with your health care provider to maintain a healthy body weight or to lose weight. Ask what an ideal weight is for you.  Get at least 30 minutes of exercise that causes your heart to beat faster (aerobic exercise) most days of the week. Activities may include walking, swimming, or biking.  Work with your health care provider or diet and nutrition specialist (dietitian) to adjust your eating plan to your individual calorie needs. Reading food labels   Check food labels for the amount of sodium per serving. Choose foods with less than 5 percent of the Daily Value of sodium. Generally, foods with less than 300 mg of sodium per serving fit into this eating plan.  To find whole grains, look for the word "whole" as the first word in the ingredient list. Shopping  Buy products labeled as "low-sodium" or "no salt added."  Buy fresh foods. Avoid canned foods and premade or frozen meals. Cooking  Avoid adding salt when cooking. Use salt-free seasonings or herbs instead of table salt or sea salt. Check with your health care provider or pharmacist before using salt substitutes.  Do not fry foods. Cook foods using healthy methods such as baking, boiling, grilling, and broiling instead.  Cook with  heart-healthy oils, such as olive, canola, soybean, or sunflower oil. Meal planning  Eat a balanced diet that includes: ? 5 or more servings of fruits and vegetables each day. At each meal, try to fill half of your plate with fruits and vegetables. ? Up to 6-8 servings of whole grains each day. ? Less than 6 oz of lean meat, poultry, or fish each day. A 3-oz serving of meat is about the same size as a deck of cards. One egg equals 1 oz. ? 2 servings of low-fat dairy each day. ? A serving of nuts, seeds, or beans 5 times each week. ? Heart-healthy fats. Healthy fats called Omega-3 fatty acids are found in foods such as flaxseeds and coldwater fish, like sardines, salmon, and mackerel.  Limit how much you eat of the following: ? Canned or prepackaged foods. ? Food that is high in trans fat, such as fried foods. ? Food that is high in saturated fat, such as fatty meat. ? Sweets, desserts, sugary drinks, and other foods with added sugar. ? Full-fat dairy products.  Do not salt foods before eating.  Try to eat at least 2 vegetarian meals each week.  Eat more home-cooked food and less restaurant, buffet, and fast food.  When eating at a restaurant, ask that your food be prepared with less salt or no salt, if possible. What foods are recommended? The items listed may not be a complete list. Talk with your dietitian about   what dietary choices are best for you. Grains Whole-grain or whole-wheat bread. Whole-grain or whole-wheat pasta. Brown rice. Oatmeal. Quinoa. Bulgur. Whole-grain and low-sodium cereals. Pita bread. Low-fat, low-sodium crackers. Whole-wheat flour tortillas. Vegetables Fresh or frozen vegetables (raw, steamed, roasted, or grilled). Low-sodium or reduced-sodium tomato and vegetable juice. Low-sodium or reduced-sodium tomato sauce and tomato paste. Low-sodium or reduced-sodium canned vegetables. Fruits All fresh, dried, or frozen fruit. Canned fruit in natural juice (without  added sugar). Meat and other protein foods Skinless chicken or turkey. Ground chicken or turkey. Pork with fat trimmed off. Fish and seafood. Egg whites. Dried beans, peas, or lentils. Unsalted nuts, nut butters, and seeds. Unsalted canned beans. Lean cuts of beef with fat trimmed off. Low-sodium, lean deli meat. Dairy Low-fat (1%) or fat-free (skim) milk. Fat-free, low-fat, or reduced-fat cheeses. Nonfat, low-sodium ricotta or cottage cheese. Low-fat or nonfat yogurt. Low-fat, low-sodium cheese. Fats and oils Soft margarine without trans fats. Vegetable oil. Low-fat, reduced-fat, or light mayonnaise and salad dressings (reduced-sodium). Canola, safflower, olive, soybean, and sunflower oils. Avocado. Seasoning and other foods Herbs. Spices. Seasoning mixes without salt. Unsalted popcorn and pretzels. Fat-free sweets. What foods are not recommended? The items listed may not be a complete list. Talk with your dietitian about what dietary choices are best for you. Grains Baked goods made with fat, such as croissants, muffins, or some breads. Dry pasta or rice meal packs. Vegetables Creamed or fried vegetables. Vegetables in a cheese sauce. Regular canned vegetables (not low-sodium or reduced-sodium). Regular canned tomato sauce and paste (not low-sodium or reduced-sodium). Regular tomato and vegetable juice (not low-sodium or reduced-sodium). Pickles. Olives. Fruits Canned fruit in a light or heavy syrup. Fried fruit. Fruit in cream or butter sauce. Meat and other protein foods Fatty cuts of meat. Ribs. Fried meat. Bacon. Sausage. Bologna and other processed lunch meats. Salami. Fatback. Hotdogs. Bratwurst. Salted nuts and seeds. Canned beans with added salt. Canned or smoked fish. Whole eggs or egg yolks. Chicken or turkey with skin. Dairy Whole or 2% milk, cream, and half-and-half. Whole or full-fat cream cheese. Whole-fat or sweetened yogurt. Full-fat cheese. Nondairy creamers. Whipped toppings.  Processed cheese and cheese spreads. Fats and oils Butter. Stick margarine. Lard. Shortening. Ghee. Bacon fat. Tropical oils, such as coconut, palm kernel, or palm oil. Seasoning and other foods Salted popcorn and pretzels. Onion salt, garlic salt, seasoned salt, table salt, and sea salt. Worcestershire sauce. Tartar sauce. Barbecue sauce. Teriyaki sauce. Soy sauce, including reduced-sodium. Steak sauce. Canned and packaged gravies. Fish sauce. Oyster sauce. Cocktail sauce. Horseradish that you find on the shelf. Ketchup. Mustard. Meat flavorings and tenderizers. Bouillon cubes. Hot sauce and Tabasco sauce. Premade or packaged marinades. Premade or packaged taco seasonings. Relishes. Regular salad dressings. Where to find more information:  National Heart, Lung, and Blood Institute: www.nhlbi.nih.gov  American Heart Association: www.heart.org Summary  The DASH eating plan is a healthy eating plan that has been shown to reduce high blood pressure (hypertension). It may also reduce your risk for type 2 diabetes, heart disease, and stroke.  With the DASH eating plan, you should limit salt (sodium) intake to 2,300 mg a day. If you have hypertension, you may need to reduce your sodium intake to 1,500 mg a day.  When on the DASH eating plan, aim to eat more fresh fruits and vegetables, whole grains, lean proteins, low-fat dairy, and heart-healthy fats.  Work with your health care provider or diet and nutrition specialist (dietitian) to adjust your eating plan to your   individual calorie needs. This information is not intended to replace advice given to you by your health care provider. Make sure you discuss any questions you have with your health care provider. Document Revised: 05/10/2017 Document Reviewed: 05/21/2016 Elsevier Patient Education  2020 Elsevier Inc. Hypertension, Adult Hypertension is another name for high blood pressure. High blood pressure forces your heart to work harder to pump  blood. This can cause problems over time. There are two numbers in a blood pressure reading. There is a top number (systolic) over a bottom number (diastolic). It is best to have a blood pressure that is below 120/80. Healthy choices can help lower your blood pressure, or you may need medicine to help lower it. What are the causes? The cause of this condition is not known. Some conditions may be related to high blood pressure. What increases the risk?  Smoking.  Having type 2 diabetes mellitus, high cholesterol, or both.  Not getting enough exercise or physical activity.  Being overweight.  Having too much fat, sugar, calories, or salt (sodium) in your diet.  Drinking too much alcohol.  Having long-term (chronic) kidney disease.  Having a family history of high blood pressure.  Age. Risk increases with age.  Race. You may be at higher risk if you are African American.  Gender. Men are at higher risk than women before age 45. After age 65, women are at higher risk than men.  Having obstructive sleep apnea.  Stress. What are the signs or symptoms?  High blood pressure may not cause symptoms. Very high blood pressure (hypertensive crisis) may cause: ? Headache. ? Feelings of worry or nervousness (anxiety). ? Shortness of breath. ? Nosebleed. ? A feeling of being sick to your stomach (nausea). ? Throwing up (vomiting). ? Changes in how you see. ? Very bad chest pain. ? Seizures. How is this treated?  This condition is treated by making healthy lifestyle changes, such as: ? Eating healthy foods. ? Exercising more. ? Drinking less alcohol.  Your health care provider may prescribe medicine if lifestyle changes are not enough to get your blood pressure under control, and if: ? Your top number is above 130. ? Your bottom number is above 80.  Your personal target blood pressure may vary. Follow these instructions at home: Eating and drinking   If told, follow the DASH  eating plan. To follow this plan: ? Fill one half of your plate at each meal with fruits and vegetables. ? Fill one fourth of your plate at each meal with whole grains. Whole grains include whole-wheat pasta, brown rice, and whole-grain bread. ? Eat or drink low-fat dairy products, such as skim milk or low-fat yogurt. ? Fill one fourth of your plate at each meal with low-fat (lean) proteins. Low-fat proteins include fish, chicken without skin, eggs, beans, and tofu. ? Avoid fatty meat, cured and processed meat, or chicken with skin. ? Avoid pre-made or processed food.  Eat less than 1,500 mg of salt each day.  Do not drink alcohol if: ? Your doctor tells you not to drink. ? You are pregnant, may be pregnant, or are planning to become pregnant.  If you drink alcohol: ? Limit how much you use to:  0-1 drink a day for women.  0-2 drinks a day for men. ? Be aware of how much alcohol is in your drink. In the [place] one drink equals one 12 oz bottle of beer (355 mL), one 5 oz glass of wine (148 mL),   or one 1 oz glass of hard liquor (44 mL). Lifestyle   Work with your doctor to stay at a healthy weight or to lose weight. Ask your doctor what the best weight is for you.  Get at least 30 minutes of exercise most days of the week. This may include walking, swimming, or biking.  Get at least 30 minutes of exercise that strengthens your muscles (resistance exercise) at least 3 days a week. This may include lifting weights or doing Pilates.  Do not use any products that contain nicotine or tobacco, such as cigarettes, e-cigarettes, and chewing tobacco. If you need help quitting, ask your doctor.  Check your blood pressure at home as told by your doctor.  Keep all follow-up visits as told by your doctor. This is important. Medicines  Take over-the-counter and prescription medicines only as told by your doctor. Follow directions carefully.  Do not skip doses of blood pressure medicine. The  medicine does not work as well if you skip doses. Skipping doses also puts you at risk for problems.  Ask your doctor about side effects or reactions to medicines that you should watch for. Contact a doctor if you:  Think you are having a reaction to the medicine you are taking.  Have headaches that keep coming back (recurring).  Feel dizzy.  Have swelling in your ankles.  Have trouble with your vision. Get help right away if you:  Get a very bad headache.  Start to feel mixed up (confused).  Feel weak or numb.  Feel faint.  Have very bad pain in your: ? Chest. ? Belly (abdomen).  Throw up more than once.  Have trouble breathing. Summary  Hypertension is another name for high blood pressure.  High blood pressure forces your heart to work harder to pump blood.  For most people, a normal blood pressure is less than 120/80.  Making healthy choices can help lower blood pressure. If your blood pressure does not get lower with healthy choices, you may need to take medicine. This information is not intended to replace advice given to you by your health care provider. Make sure you discuss any questions you have with your health care provider. Document Revised: 02/05/2018 Document Reviewed: 02/05/2018 Elsevier Patient Education  2020 Elsevier Inc.  

## 2020-03-11 NOTE — Progress Notes (Signed)
Established patient visit   Patient: Keith Cohen   DOB: 1975-06-27   44 y.o. Male  MRN: [id number] Visit Date: 03/11/2020  Today's healthcare provider: Lavon Paganini, MD   Chief Complaint  Patient presents with  . Hypertension   Subjective    HPI  Elevated Blood Pressure:   Pt is coming in today to have his BP checked.  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  Pt has had a history elevated bp in the past.  Has never taken an anti-HTN medication   Pre-Diabetes Mellitus Type II, Follow-up  Lab Results  Component Value Date   HGBA1C 5.8 (H) 04/09/2019   HGBA1C 5.9 (H) 03/27/2017   HGBA1C 5.6 02/29/2016   Wt Readings from Last 3 Encounters:  03/11/20 194 lb (88 kg)  03/08/20 187 lb (84.8 kg)  06/24/19 200 lb (90.7 kg)   Last seen for pre-diabetes 1 year ago.  Management since then includes none. He reports excellent compliance with treatment. He is not having side effects.  Symptoms: No fatigue No foot ulcerations  No appetite changes No nausea  No paresthesia of the feet  No polydipsia  No polyuria No visual disturbances   No vomiting       Pertinent Labs: Lab Results  Component Value Date   CHOL 251 (H) 03/08/2020   HDL 47 03/08/2020   LDLCALC 170 (H) 03/08/2020   TRIG 183 (H) 03/08/2020   CHOLHDL 5.3 (H) 03/08/2020   Lab Results  Component Value Date   NA 140 04/09/2019   K 4.0 04/09/2019   CREATININE 0.83 04/09/2019   GFRNONAA 107 04/09/2019   GFRAA 124 04/09/2019   GLUCOSE 90 03/08/2020     ---------------------------------------------------------------------------------------------------   Patient Active Problem List   Diagnosis Date Noted  . Primary hypertension 03/11/2020  . Prediabetes 03/11/2020  . Mixed hyperlipidemia 07/04/2018  . Avitaminosis D 07/04/2018  . Lipoma of torso 03/31/2018  . Family history of prostate cancer 03/31/2018  . Sleep disorder, shift-work 03/31/2018  . Axillary hidradenitis  suppurativa 08/19/2014   No past medical history on file. Social History   Tobacco Use  . Smoking status: Current Some Day Smoker    Packs/day: 0.25    Years: 15.00    Pack years: 3.75    Types: Cigarettes  . Smokeless tobacco: Former Systems developer    Types: Secondary school teacher  . Vaping Use: Former  Substance Use Topics  . Alcohol use: Yes    Alcohol/week: 10.0 standard drinks    Types: 10 Cans of beer per week  . Drug use: No   No Known Allergies   Medications: Outpatient Medications Prior to Visit  Medication Sig  . COD LIVER OIL PO Take by mouth as needed.  . eszopiclone (LUNESTA) 2 MG TABS tablet TAKE 1 TABLET (2 MG TOTAL) BY MOUTH AT BEDTIME AS NEEDED FOR SLEEP. TAKE IMMEDIATELY BEFORE BEDTIME  . Multiple Vitamin (MULTIVITAMIN) capsule Take 1 capsule by mouth daily.  . Vitamin D, Ergocalciferol, (DRISDOL) 1.25 MG (50000 UT) CAPS capsule TAKE 1 CAPSULE BY MOUTH ONE TIME PER WEEK   No facility-administered medications prior to visit.    Review of Systems  Constitutional: Negative.   Respiratory: Negative.   Cardiovascular: Negative for chest pain, palpitations (Pt states he had heart palpitations about a month ago. ) and leg swelling.  Gastrointestinal: Negative.   Neurological: Negative for dizziness, light-headedness and headaches.      Objective    BP Marland Kitchen)  134/92 (BP Location: Left Arm, Patient Position: Sitting, Cuff Size: Large)   Pulse 84   Temp 98.3 F (36.8 C) (Oral)   Wt 194 lb (88 kg)   BMI 29.50 kg/m    Physical Exam Vitals reviewed.  Constitutional:      General: He is not in acute distress.    Appearance: Normal appearance. He is not diaphoretic.  HENT:     Head: Normocephalic and atraumatic.  Eyes:     General: No scleral icterus.    Conjunctiva/sclera: Conjunctivae normal.  Cardiovascular:     Rate and Rhythm: Normal rate and regular rhythm.     Pulses: Normal pulses.     Heart sounds: Normal heart sounds. No murmur heard.   Pulmonary:      Effort: Pulmonary effort is normal. No respiratory distress.     Breath sounds: Normal breath sounds. No wheezing or rhonchi.  Abdominal:     General: There is no distension.     Palpations: Abdomen is soft.     Tenderness: There is no abdominal tenderness.  Musculoskeletal:     Cervical back: Neck supple.     Right lower leg: No edema.     Left lower leg: No edema.  Lymphadenopathy:     Cervical: No cervical adenopathy.  Skin:    General: Skin is warm and dry.     Capillary Refill: Capillary refill takes less than 2 seconds.     Findings: No rash.  Neurological:     Mental Status: He is alert and oriented to person, place, and time.     Cranial Nerves: No cranial nerve deficit.  Psychiatric:        Mood and Affect: Mood normal.        Behavior: Behavior normal.       No results found for any visits on 03/11/20.  Assessment & Plan     Problem List Items Addressed This Visit      Cardiovascular and Mediastinum   Primary hypertension - Primary    New diagnosis Uncontrolled Discussed lifestyle management Start HCTZ 12.5 mg daily Check BMP F/u in 1 month      Relevant Medications   hydrochlorothiazide (HYDRODIURIL) 12.5 MG tablet   Other Relevant Orders   Basic Metabolic Panel (BMET)     Other   Mixed hyperlipidemia    Reviewed last lipid panel Encourage lifestyle changes No statin currently Recheck in 3-6 months      Relevant Medications   hydrochlorothiazide (HYDRODIURIL) 12.5 MG tablet   Other Relevant Orders   Basic Metabolic Panel (BMET)   Prediabetes    Encourage low carb diet Recheck A1c      Relevant Orders   Hemoglobin E7N   Basic Metabolic Panel (BMET)       Return in about 4 weeks (around 04/08/2020) for CPE, BP f/u, as scheduled.      I, Lavon Paganini, MD, have reviewed all documentation for this visit. The documentation on 03/11/20 for the exam, diagnosis, procedures, and orders are all accurate and complete.   Amandamarie Feggins,  Dionne Bucy, MD, MPH [facility]

## 2020-03-11 NOTE — Assessment & Plan Note (Signed)
Reviewed last lipid panel Encourage lifestyle changes No statin currently Recheck in 3-6 months

## 2020-03-12 LAB — BASIC METABOLIC PANEL
BUN/Creatinine Ratio: 15 (ref 9–20)
BUN: 10 mg/dL (ref 6–24)
CO2: 22 mmol/L (ref 20–29)
Calcium: 9.1 mg/dL (ref 8.7–10.2)
Chloride: 99 mmol/L (ref 96–106)
Creatinine, Ser: 0.65 mg/dL — ABNORMAL LOW (ref 0.76–1.27)
GFR calc Af Amer: 136 mL/min/{1.73_m2} (ref >59)
GFR calc non Af Amer: 117 mL/min/{1.73_m2} (ref >59)
Glucose: 116 mg/dL — ABNORMAL HIGH (ref 65–99)
Potassium: 3.9 mmol/L (ref 3.5–5.2)
Sodium: 136 mmol/L (ref 134–144)

## 2020-03-12 LAB — HEMOGLOBIN A1C
Est. average glucose Bld gHb Est-mCnc: 134 mg/dL
Hgb A1c MFr Bld: 6.3 % — ABNORMAL HIGH (ref 4.8–5.6)

## 2020-03-15 ENCOUNTER — Telehealth: Payer: Self-pay

## 2020-03-15 NOTE — Telephone Encounter (Signed)
Patient advised as directed below. 

## 2020-03-15 NOTE — Telephone Encounter (Signed)
-----   Message from Mar Daring, PA-C sent at 03/14/2020  7:24 PM EDT ----- Good Evening,  Dr. Brita Romp is out of town so I am resulting notes for her.  Your A1c has increased compared to last check. Was 5.8 and is now 6.3. Limit carbohydrates and sugars. Kidney function is normal. Sodium, potassium, nad calcium are normal.

## 2020-03-16 ENCOUNTER — Telehealth: Payer: Self-pay

## 2020-03-16 NOTE — Telephone Encounter (Signed)
Copied from [place] [phone]. Topic: General - Other >> Mar 16, 2020 11:21 AM Keene Breath wrote: Reason for CRM: Patient called to ask the nurse to call him regarding getting tested for trichonosis.  He is not sure what it is but his partner apparently tested positive and he would like a test as well.  Please advise and call to discuss at [phone]  or [phone]

## 2020-03-16 NOTE — Telephone Encounter (Signed)
Left message to call back. Pt would need an appt to have testing done.

## 2020-03-17 NOTE — Progress Notes (Signed)
I,April Miller,acting as a scribe for [employer], FNP.,have documented all relevant documentation on the behalf of Marcille Buffy, FNP,as directed by  Marcille Buffy, FNP while in the presence of Marcille Buffy, [place].   Established patient visit   Patient: Keith Cohen   DOB: 08-19-1975   44 y.o. Male  MRN: [id number] Visit Date: 03/18/2020  Today's healthcare provider: Marcille Buffy, FNP   Chief Complaint  Patient presents with  . Exposure to STD   Subjective    Exposure to STD This is a new problem. Pertinent negatives include no abdominal pain, chest pain, chills, fever, nausea, shortness of breath or vomiting.    He reports he was notified two days ago that his partner from June 2021 that he has had no contact with since , had trichomonas and he had protected sex with a condom was broke. He is asymptomatic. He believes she could have had the STD when they had intercourse.   He would also like screening for RPR, Hep C. Denies any known exposures.  Denies any rectal pain or pressure.  He would like tested, he was offered treatment given exposure, but prefers to wait on test.  He has no symptoms.   Patient  denies any fever, body aches,chills, rash, chest pain, shortness of breath, nausea, vomiting, or diarrhea.     Patient Active Problem List   Diagnosis Date Noted  . Exposure to trichomonas 03/18/2020  . Primary hypertension 03/11/2020  . Prediabetes 03/11/2020  . Mixed hyperlipidemia 07/04/2018  . Avitaminosis D 07/04/2018  . Lipoma of torso 03/31/2018  . Family history of prostate cancer 03/31/2018  . Sleep disorder, shift-work 03/31/2018  . Axillary hidradenitis suppurativa 08/19/2014   History reviewed. No pertinent past medical history. Past Surgical History:  Procedure Laterality Date  . WISDOM TOOTH EXTRACTION  2003   Social History   Tobacco Use  . Smoking status: Current Some Day Smoker     Packs/day: 0.25    Years: 15.00    Pack years: 3.75    Types: Cigarettes  . Smokeless tobacco: Former Systems developer    Types: Secondary school teacher  . Vaping Use: Former  Substance Use Topics  . Alcohol use: Yes    Alcohol/week: 10.0 standard drinks    Types: 10 Cans of beer per week  . Drug use: No   Social History   Socioeconomic History  . Marital status: Divorced    Spouse name: Not on file  . Number of children: 0  . Years of education: Not on file  . Highest education level: Not on file  Occupational History  . Not on file  Tobacco Use  . Smoking status: Current Some Day Smoker    Packs/day: 0.25    Years: 15.00    Pack years: 3.75    Types: Cigarettes  . Smokeless tobacco: Former Systems developer    Types: Secondary school teacher  . Vaping Use: Former  Substance and Sexual Activity  . Alcohol use: Yes    Alcohol/week: 10.0 standard drinks    Types: 10 Cans of beer per week  . Drug use: No  . Sexual activity: Yes    Partners: Female    Birth control/protection: Condom  Other Topics Concern  . Not on file  Social History Narrative  . Not on file   Social Determinants of Health   Financial Resource Strain:   . Difficulty of Paying Living Expenses: Not on file  Food  Insecurity:   . Worried About Charity fundraiser in the Last Year: Not on file  . Ran Out of Food in the Last Year: Not on file  Transportation Needs:   . Lack of Transportation (Medical): Not on file  . Lack of Transportation (Non-Medical): Not on file  Physical Activity:   . Days of Exercise per Week: Not on file  . Minutes of Exercise per Session: Not on file  Stress:   . Feeling of Stress : Not on file  Social Connections:   . Frequency of Communication with Friends and Family: Not on file  . Frequency of Social Gatherings with Friends and Family: Not on file  . Attends Religious Services: Not on file  . Active Member of Clubs or Organizations: Not on file  . Attends Archivist Meetings: Not on file  .  Marital Status: Not on file  Intimate Partner Violence:   . Fear of Current or Ex-Partner: Not on file  . Emotionally Abused: Not on file  . Physically Abused: Not on file  . Sexually Abused: Not on file   Family Status  Relation Name Status  . Mother  Alive  . Father  Alive  . PGF  Deceased  . MGM  Deceased  . MGF  Deceased  . PGM  Deceased  . Annamarie Major  (Not Specified)  . Neg Hx  (Not Specified)   Family History  Problem Relation Age of Onset  . Diabetes Mother   . Healthy Father   . Diabetes Paternal Grandfather   . Prostate cancer Paternal Uncle   . Colon cancer Neg Hx   . Breast cancer Neg Hx    No Known Allergies     Medications: Outpatient Medications Prior to Visit  Medication Sig  . COD LIVER OIL PO Take by mouth as needed.  . eszopiclone (LUNESTA) 2 MG TABS tablet TAKE 1 TABLET (2 MG TOTAL) BY MOUTH AT BEDTIME AS NEEDED FOR SLEEP. TAKE IMMEDIATELY BEFORE BEDTIME  . hydrochlorothiazide (HYDRODIURIL) 12.5 MG tablet Take 1 tablet (12.5 mg total) by mouth daily.  . Multiple Vitamin (MULTIVITAMIN) capsule Take 1 capsule by mouth daily.  . Vitamin D, Ergocalciferol, (DRISDOL) 1.25 MG (50000 UT) CAPS capsule TAKE 1 CAPSULE BY MOUTH ONE TIME PER WEEK   No facility-administered medications prior to visit.    Review of Systems  Constitutional: Negative for activity change, appetite change, chills, diaphoresis, fatigue, fever and unexpected weight change.  HENT: Negative.   Respiratory: Negative.  Negative for chest tightness, shortness of breath and wheezing.   Cardiovascular: Negative.  Negative for chest pain and palpitations.  Gastrointestinal: Negative for abdominal pain, nausea and vomiting.  Genitourinary: Negative.   Musculoskeletal: Negative.   Neurological: Negative.   Hematological: Negative.   Psychiatric/Behavioral: Negative.     Last CBC Lab Results  Component Value Date   WBC 6.3 04/09/2019   HGB 14.4 04/09/2019   HCT 40.8 04/09/2019   MCV 89  04/09/2019   MCH 31.4 04/09/2019   RDW 11.8 04/09/2019   PLT 197 16/03/9603   Last metabolic panel Lab Results  Component Value Date   GLUCOSE 116 (H) 03/11/2020   NA 136 03/11/2020   K 3.9 03/11/2020   CL 99 03/11/2020   CO2 22 03/11/2020   BUN 10 03/11/2020   CREATININE 0.65 (L) 03/11/2020   GFRNONAA 117 03/11/2020   GFRAA 136 03/11/2020   CALCIUM 9.1 03/11/2020   PHOS 3.6 03/27/2017   PROT 6.1 04/09/2019  ALBUMIN 4.3 04/09/2019   LABGLOB 1.8 04/09/2019   AGRATIO 2.4 (H) 04/09/2019   BILITOT 0.5 04/09/2019   ALKPHOS 70 04/09/2019   AST 19 04/09/2019   ALT 30 04/09/2019   Last lipids Lab Results  Component Value Date   CHOL 251 (H) 03/08/2020   HDL 47 03/08/2020   LDLCALC 170 (H) 03/08/2020   TRIG 183 (H) 03/08/2020   CHOLHDL 5.3 (H) 03/08/2020   Last hemoglobin A1c Lab Results  Component Value Date   HGBA1C 6.3 (H) 03/11/2020   Last thyroid functions Lab Results  Component Value Date   TSH 2.640 04/09/2019   T4TOTAL 5.9 03/27/2017   Last vitamin D Lab Results  Component Value Date   VD25OH 21.3 (L) 04/09/2019   Last vitamin B12 and Folate No results found for: VITAMINB12, FOLATE    Objective    BP 132/74 (BP Location: Right Arm, Patient Position: Sitting, Cuff Size: Large)   Pulse 75   Temp 98.6 F (37 C) (Oral)   Resp 16   Ht 5\' 8"  (1.727 m)   Wt 198 lb (89.8 kg)   SpO2 98%   BMI 30.11 kg/m    Physical Exam Constitutional:      General: He is not in acute distress.    Appearance: He is normal weight. He is not ill-appearing, toxic-appearing or diaphoretic.  HENT:     Head: Normocephalic and atraumatic.     Right Ear: External ear normal.     Left Ear: External ear normal.     Mouth/Throat:     Mouth: Mucous membranes are moist.  Eyes:     General: No scleral icterus.    Conjunctiva/sclera: Conjunctivae normal.  Cardiovascular:     Rate and Rhythm: Normal rate and regular rhythm.     Heart sounds: Normal heart sounds. No murmur  heard.  No friction rub. No gallop.   Pulmonary:     Effort: Pulmonary effort is normal.     Breath sounds: Normal breath sounds.  Genitourinary:    Comments: Declined need for exam.  Musculoskeletal:        General: Normal range of motion.     Cervical back: Normal range of motion and neck supple.  Skin:    General: Skin is warm.     Capillary Refill: Capillary refill takes less than 2 seconds.     Findings: No rash.  Neurological:     Mental Status: He is alert and oriented to person, place, and time.  Psychiatric:        Mood and Affect: Mood normal.        Behavior: Behavior normal.        Thought Content: Thought content normal.        Judgment: Judgment normal.     No results found for any visits on 03/18/20.  Assessment & Plan     Exposure to trichomonas  STD exposure - Plan: Urine cytology ancillary only, HIV antibody (with reflex), RPR, Hepatitis C Antibody  Recommend treatment with : Metronidazole 2 g orally in a single dose  He prefers to hold off on treatment until urine returns, did review etiology of trichomonas and asymptomatic cases as well.   Red Flags discussed. The patient was given clear instructions to go to ER or return to medical center if any red flags develop, symptoms do not improve, worsen or new problems develop. They verbalized understanding.   Return in about 1 week (around 03/25/2020), or if symptoms worsen or fail  to improve, for at any time for any worsening symptoms.       I[place] Minnie Shi, FNP, have reviewed all documentation for this visit. The documentation on 03/18/20 for the exam, diagnosis, procedures, and orders are all accurate and complete.  The entirety of the information documented in the History of Present Illness, Review of Systems and Physical Exam were personally obtained by me. Portions of this information were initially documented by the CMA and reviewed by me for thoroughness and accuracy.     Marcille Buffy, [place] [phone] (phone) [phone] (fax)  [place]

## 2020-03-18 ENCOUNTER — Other Ambulatory Visit: Payer: Self-pay | Admitting: Adult Health

## 2020-03-18 ENCOUNTER — Other Ambulatory Visit: Payer: Self-pay

## 2020-03-18 ENCOUNTER — Encounter: Payer: Self-pay | Admitting: Adult Health

## 2020-03-18 ENCOUNTER — Other Ambulatory Visit (HOSPITAL_COMMUNITY)
Admission: RE | Admit: 2020-03-18 | Discharge: 2020-03-18 | Disposition: A | Discharge status: Home / Self Care | Payer: Managed Care, Other (non HMO) | Source: Ambulatory Visit | Attending: Adult Health | Admitting: Adult Health

## 2020-03-18 ENCOUNTER — Ambulatory Visit (INDEPENDENT_AMBULATORY_CARE_PROVIDER_SITE_OTHER): Payer: Managed Care, Other (non HMO) | Admitting: Adult Health

## 2020-03-18 VITALS — BP 132/74 | HR 75 | Temp 98.6°F | Resp 16 | Ht 68.0 in | Wt 198.0 lb

## 2020-03-18 DIAGNOSIS — Z202 Contact with and (suspected) exposure to infections with a predominantly sexual mode of transmission: Secondary | ICD-10-CM | POA: Insufficient documentation

## 2020-03-18 NOTE — Patient Instructions (Signed)
Preventing Sexually Transmitted Infections, Adult Sexually transmitted infections (STIs) are diseases that are passed (transmitted) from person to person through bodily fluids exchanged during sex or sexual contact. Bodily fluids include saliva, semen, blood, vaginal mucus, and urine. You may have an increased risk for developing an STI if you have unprotected oral, vaginal, or anal sex. Some common STIs include:  Herpes.  Hepatitis B.  Chlamydia.  Gonorrhea.  Syphilis.  HPV (human papillomavirus).  HIV (human immunodeficiency virus), the virus that can cause AIDS (acquired immunodeficiency syndrome). How can I protect myself from sexually transmitted infections? The only way to completely prevent STIs is not to have sex of any kind (practice abstinence). This includes oral, vaginal, or anal sex. If you are sexually active, take these actions to lower your risk of getting an STI:  Have only one sex partner (be monogamous) or limit the number of sexual partners you have.  Stay up-to-date on immunizations. Certain vaccines can lower your risk of getting certain STIs, such as: ? Hepatitis A and B vaccines. You may have been vaccinated as a young child, but likely need a booster shot as a teen or young adult. ? HPV vaccine.  Use methods that prevent the exchange of body fluids between partners (barrier protection) every time you have sex. Barrier protection can be used during oral, vaginal, or anal sex. Commonly used barrier methods include: ? Male condom. ? Male condom. ? Dental dam.  Get tested regularly for STIs. Have your sexual partner get tested regularly as well.  Avoid mixing alcohol, drugs, and sex. Alcohol and drug use can affect your ability to make good decisions and can lead to risky sexual behaviors.  Ask your health care provider about taking pre-exposure prophylaxis (PrEP) to prevent HIV infection if you: ? Have a HIV-positive sexual partner. ? Have multiple sexual  partners or partners who do not know their HIV status, and do not regularly use a condom during sex. ? Use injection drugs and share needles. Birth control pills, injections, implants, and intrauterine devices (IUDs) do not protect against STIs. To prevent both STIs and pregnancy, always use a condom with another form of birth control. Some STIs, such as herpes, are spread through skin to skin contact. A condom does not protect you from getting such STIs. If you or your partner have herpes and there is an active flare with open sores, avoid all sexual contact. Why are these changes important? Taking steps to practice safe sex protects you and others. Many STIs can be cured. However, some STIs are not curable and will affect you for the rest of your life. STIs can be passed on to another person even if you do not have symptoms. What can happen if changes are not made? Certain STIs may:  Require you to take medicine for the rest of your life.  Affect your ability to have children (your fertility).  Increase your risk for developing another STI or certain serious health conditions, such as: ? Cervical cancer. ? Head and neck cancer. ? Pelvic inflammatory disease (PID) in women. ? Organ damage or damage to other parts of your body, if the infection spreads.  Be passed to a baby during childbirth. How are sexually transmitted infections treated? If you or your partner know or think that you may have an STI:  Talk with your health care provider about what can be done to treat it. Some STIs can be treated and cured with medicines.  For curable STIs, you and   your partner should avoid sex during treatment and for several days after treatment is complete.  You and your partner should both be treated at the same time, if there is any chance that your partner is infected as well. If you get treatment but your partner does not, your partner can re-infect you when you resume sexual contact.  Do not  have unprotected sex. Where to find more information Learn more about sexually transmitted diseases and infections from:  Centers for Disease Control and Prevention: ? More information about specific STIs: www.cdc.gov/std ? Find places to get sexual health counseling and treatment for free or for a low cost: gettested.cdc.gov  [place].S. Department of Health and Human Services: www.womenshealth.gov/publications/our-publications/fact-sheet/sexually-transmitted-infections.html Summary  The only way to completely prevent STIs is not to have sex (practice abstinence), including oral, vaginal, or anal sex.  STIs can spread through saliva, semen, blood, vaginal mucus, urine, or sexual contact.  If you do have sex, limit your number of sexual partners and use a barrier protection method every time you have sex.  If you develop an STI, get treated right away and ask your partner to be treated as well. Do not resume having sex until both of you have completed treatment for the STI. This information is not intended to replace advice given to you by your health care provider. Make sure you discuss any questions you have with your health care provider. Document Revised: 10/21/2018 Document Reviewed: 05/24/2016 Elsevier Patient Education  2020 Elsevier Inc.  

## 2020-03-19 LAB — HIV ANTIBODY (ROUTINE TESTING W REFLEX): HIV Screen 4th Generation wRfx: NONREACTIVE

## 2020-03-19 LAB — RPR: RPR Ser Ql: NONREACTIVE

## 2020-03-19 LAB — HEPATITIS C ANTIBODY: Hep C Virus Ab: <0.1 s/co ratio (ref 0.0–0.9)

## 2020-03-21 LAB — URINE CYTOLOGY ANCILLARY ONLY
Candida Urine: NEGATIVE
Chlamydia: NEGATIVE
Comment: NEGATIVE
Comment: NEGATIVE
Comment: NORMAL
Neisseria Gonorrhea: NEGATIVE
Trichomonas: NEGATIVE

## 2020-03-22 NOTE — Progress Notes (Signed)
Urine was negative for STD's

## 2020-03-23 NOTE — Progress Notes (Signed)
HIV screening negative, RPR for syphilis negative, Hepatitis C screening negative. Recommend follow up as discussed per CDC guidelines.

## 2020-04-06 ENCOUNTER — Other Ambulatory Visit: Payer: Self-pay | Admitting: Family Medicine

## 2020-04-11 ENCOUNTER — Encounter: Payer: Self-pay | Admitting: Family Medicine

## 2020-04-11 ENCOUNTER — Other Ambulatory Visit: Payer: Self-pay

## 2020-04-11 ENCOUNTER — Ambulatory Visit (INDEPENDENT_AMBULATORY_CARE_PROVIDER_SITE_OTHER): Payer: Managed Care, Other (non HMO) | Admitting: Family Medicine

## 2020-04-11 VITALS — BP 138/82 | HR 81 | Temp 98.0°F | Resp 16 | Ht 68.0 in | Wt 196.6 lb

## 2020-04-11 DIAGNOSIS — E663 Overweight: Secondary | ICD-10-CM

## 2020-04-11 DIAGNOSIS — I10 Essential (primary) hypertension: Secondary | ICD-10-CM

## 2020-04-11 DIAGNOSIS — Z0000 Encounter for general adult medical examination without abnormal findings: Secondary | ICD-10-CM

## 2020-04-11 DIAGNOSIS — Z23 Encounter for immunization: Secondary | ICD-10-CM | POA: Diagnosis not present

## 2020-04-11 DIAGNOSIS — Z1211 Encounter for screening for malignant neoplasm of colon: Secondary | ICD-10-CM

## 2020-04-11 DIAGNOSIS — G4726 Circadian rhythm sleep disorder, shift work type: Secondary | ICD-10-CM

## 2020-04-11 NOTE — Assessment & Plan Note (Signed)
Well controlled Continue current medications Reviewed recent metabolic panel F/u in 6 months  

## 2020-04-11 NOTE — Assessment & Plan Note (Signed)
Continue lunesta prn

## 2020-04-11 NOTE — Assessment & Plan Note (Signed)
Discussed importance of healthy weight management Discussed diet and exercise  

## 2020-04-11 NOTE — Patient Instructions (Signed)
Preventive Care 40-45 Years Old, Male Preventive care refers to lifestyle choices and visits with your health care provider that can promote health and wellness. This includes:  A yearly physical exam. This is also called an annual well check.  Regular dental and eye exams.  Immunizations.  Screening for certain conditions.  Healthy lifestyle choices, such as eating a healthy diet, getting regular exercise, not using drugs or products that contain nicotine and tobacco, and limiting alcohol use. What can I expect for my preventive care visit? Physical exam Your health care provider will check:  Height and weight. These may be used to calculate body mass index (BMI), which is a measurement that tells if you are at a healthy weight.  Heart rate and blood pressure.  Your skin for abnormal spots. Counseling Your health care provider may ask you questions about:  Alcohol, tobacco, and drug use.  Emotional well-being.  Home and relationship well-being.  Sexual activity.  Eating habits.  Work and work environment. What immunizations do I need?  Influenza (flu) vaccine  This is recommended every year. Tetanus, diphtheria, and pertussis (Tdap) vaccine  You may need a Td booster every 10 years. Varicella (chickenpox) vaccine  You may need this vaccine if you have not already been vaccinated. Zoster (shingles) vaccine  You may need this after age 60. Measles, mumps, and rubella (MMR) vaccine  You may need at least one dose of MMR if you were born in 1957 or later. You may also need a second dose. Pneumococcal conjugate (PCV13) vaccine  You may need this if you have certain conditions and were not previously vaccinated. Pneumococcal polysaccharide (PPSV23) vaccine  You may need one or two doses if you smoke cigarettes or if you have certain conditions. Meningococcal conjugate (MenACWY) vaccine  You may need this if you have certain conditions. Hepatitis A  vaccine  You may need this if you have certain conditions or if you travel or work in places where you may be exposed to hepatitis A. Hepatitis B vaccine  You may need this if you have certain conditions or if you travel or work in places where you may be exposed to hepatitis B. Haemophilus influenzae type b (Hib) vaccine  You may need this if you have certain risk factors. Human papillomavirus (HPV) vaccine  If recommended by your health care provider, you may need three doses over 6 months. You may receive vaccines as individual doses or as more than one vaccine together in one shot (combination vaccines). Talk with your health care provider about the risks and benefits of combination vaccines. What tests do I need? Blood tests  Lipid and cholesterol levels. These may be checked every 5 years, or more frequently if you are over 50 years old.  Hepatitis C test.  Hepatitis B test. Screening  Lung cancer screening. You may have this screening every year starting at age 55 if you have a 30-pack-year history of smoking and currently smoke or have quit within the past 15 years.  Prostate cancer screening. Recommendations will vary depending on your family history and other risks.  Colorectal cancer screening. All adults should have this screening starting at age 50 and continuing until age 75. Your health care provider may recommend screening at age 45 if you are at increased risk. You will have tests every 1-10 years, depending on your results and the type of screening test.  Diabetes screening. This is done by checking your blood sugar (glucose) after you have not eaten   for a while (fasting). You may have this done every 1-3 years.  Sexually transmitted disease (STD) testing. Follow these instructions at home: Eating and drinking  Eat a diet that includes fresh fruits and vegetables, whole grains, lean protein, and low-fat dairy products.  Take vitamin and mineral supplements as  recommended by your health care provider.  Do not drink alcohol if your health care provider tells you not to drink.  If you drink alcohol: ? Limit how much you have to 0-2 drinks a day. ? Be aware of how much alcohol is in your drink. In the [place] one drink equals one 12 oz bottle of beer (355 mL), one 5 oz glass of wine (148 mL), or one 1 oz glass of hard liquor (44 mL). Lifestyle  Take daily care of your teeth and gums.  Stay active. Exercise for at least 30 minutes on 5 or more days each week.  Do not use any products that contain nicotine or tobacco, such as cigarettes, e-cigarettes, and chewing tobacco. If you need help quitting, ask your health care provider.  If you are sexually active, practice safe sex. Use a condom or other form of protection to prevent STIs (sexually transmitted infections).  Talk with your health care provider about taking a low-dose aspirin every day starting at age 53. What's next?  Go to your health care provider once a year for a well check visit.  Ask your health care provider how often you should have your eyes and teeth checked.  Stay up to date on all vaccines. This information is not intended to replace advice given to you by your health care provider. Make sure you discuss any questions you have with your health care provider. Document Revised: 05/22/2018 Document Reviewed: 05/22/2018 Elsevier Patient Education  2020 [employer].

## 2020-04-11 NOTE — Progress Notes (Signed)
Complete physical exam   Patient: Keith Cohen   DOB: 07/27/1975   44 y.o. Male  MRN: [id number] Visit Date: 04/11/2020  Today's healthcare provider: Lavon Paganini, MD   Chief Complaint  Patient presents with  . Annual Exam   Subjective    Keith Cohen is a 44 y.o. male who presents today for a complete physical exam.  He reports consuming a general diet. Home exercise routine includes calisthenics. Gym/ health club routine includes cardio, mod to heavy weightlifting and treadmill. He generally feels well. He reports sleeping fairly well. He does not have additional problems to discuss today.  HPI    History reviewed. No pertinent past medical history. Past Surgical History:  Procedure Laterality Date  . WISDOM TOOTH EXTRACTION  2003   Social History   Socioeconomic History  . Marital status: Divorced    Spouse name: Not on file  . Number of children: 0  . Years of education: Not on file  . Highest education level: Not on file  Occupational History  . Not on file  Tobacco Use  . Smoking status: Current Some Day Smoker    Packs/day: 0.25    Years: 15.00    Pack years: 3.75    Types: Cigarettes  . Smokeless tobacco: Former Systems developer    Types: Secondary school teacher  . Vaping Use: Former  Substance and Sexual Activity  . Alcohol use: Yes    Alcohol/week: 10.0 standard drinks    Types: 10 Cans of beer per week  . Drug use: No  . Sexual activity: Yes    Partners: Female    Birth control/protection: Condom  Other Topics Concern  . Not on file  Social History Narrative  . Not on file   Social Determinants of Health   Financial Resource Strain:   . Difficulty of Paying Living Expenses: Not on file  Food Insecurity:   . Worried About Charity fundraiser in the Last Year: Not on file  . Ran Out of Food in the Last Year: Not on file  Transportation Needs:   . Lack of Transportation (Medical): Not on file  . Lack of Transportation (Non-Medical):  Not on file  Physical Activity:   . Days of Exercise per Week: Not on file  . Minutes of Exercise per Session: Not on file  Stress:   . Feeling of Stress : Not on file  Social Connections:   . Frequency of Communication with Friends and Family: Not on file  . Frequency of Social Gatherings with Friends and Family: Not on file  . Attends Religious Services: Not on file  . Active Member of Clubs or Organizations: Not on file  . Attends Archivist Meetings: Not on file  . Marital Status: Not on file  Intimate Partner Violence:   . Fear of Current or Ex-Partner: Not on file  . Emotionally Abused: Not on file  . Physically Abused: Not on file  . Sexually Abused: Not on file   Family Status  Relation Name Status  . Mother  Alive  . Father  Alive  . PGF  Deceased  . MGM  Deceased  . MGF  Deceased  . PGM  Deceased  . Annamarie Major  (Not Specified)  . Neg Hx  (Not Specified)   Family History  Problem Relation Age of Onset  . Diabetes Mother   . Healthy Father   . Diabetes Paternal Grandfather   . Prostate cancer  Paternal Uncle   . Colon cancer Neg Hx   . Breast cancer Neg Hx    No Known Allergies  Patient Care Team: Virginia Crews, MD as PCP - General (Family Medicine) Bary Castilla Forest Gleason, MD (General Surgery)   Medications: Outpatient Medications Prior to Visit  Medication Sig  . COD LIVER OIL PO Take by mouth as needed.  . eszopiclone (LUNESTA) 2 MG TABS tablet TAKE 1 TABLET (2 MG TOTAL) BY MOUTH AT BEDTIME AS NEEDED FOR SLEEP. TAKE IMMEDIATELY BEFORE BEDTIME  . hydrochlorothiazide (HYDRODIURIL) 12.5 MG tablet TAKE 1 TABLET BY MOUTH EVERY DAY  . Multiple Vitamin (MULTIVITAMIN) capsule Take 1 capsule by mouth daily.  . Vitamin D, Ergocalciferol, (DRISDOL) 1.25 MG (50000 UT) CAPS capsule TAKE 1 CAPSULE BY MOUTH ONE TIME PER WEEK   No facility-administered medications prior to visit.    Review of Systems  Constitutional: Negative.   HENT: Negative.   Eyes:  Negative.   Respiratory: Negative.   Cardiovascular: Negative.   Gastrointestinal: Negative.   Endocrine: Negative.   Genitourinary: Negative.   Musculoskeletal: Negative.   Skin: Negative.   Allergic/Immunologic: Negative.   Neurological: Negative.   Hematological: Negative.   Psychiatric/Behavioral: Negative.     Last CBC Lab Results  Component Value Date   WBC 6.3 04/09/2019   HGB 14.4 04/09/2019   HCT 40.8 04/09/2019   MCV 89 04/09/2019   MCH 31.4 04/09/2019   RDW 11.8 04/09/2019   PLT 197 00/86/7619   Last metabolic panel Lab Results  Component Value Date   GLUCOSE 116 (H) 03/11/2020   NA 136 03/11/2020   K 3.9 03/11/2020   CL 99 03/11/2020   CO2 22 03/11/2020   BUN 10 03/11/2020   CREATININE 0.65 (L) 03/11/2020   GFRNONAA 117 03/11/2020   GFRAA 136 03/11/2020   CALCIUM 9.1 03/11/2020   PHOS 3.6 03/27/2017   PROT 6.1 04/09/2019   ALBUMIN 4.3 04/09/2019   LABGLOB 1.8 04/09/2019   AGRATIO 2.4 (H) 04/09/2019   BILITOT 0.5 04/09/2019   ALKPHOS 70 04/09/2019   AST 19 04/09/2019   ALT 30 04/09/2019   Last lipids Lab Results  Component Value Date   CHOL 251 (H) 03/08/2020   HDL 47 03/08/2020   LDLCALC 170 (H) 03/08/2020   TRIG 183 (H) 03/08/2020   CHOLHDL 5.3 (H) 03/08/2020   Last hemoglobin A1c Lab Results  Component Value Date   HGBA1C 6.3 (H) 03/11/2020   Last thyroid functions Lab Results  Component Value Date   TSH 2.640 04/09/2019   T4TOTAL 5.9 03/27/2017   Last vitamin D Lab Results  Component Value Date   VD25OH 21.3 (L) 04/09/2019      Objective    BP 138/82 (BP Location: Left Arm, Patient Position: Sitting, Cuff Size: Normal)   Pulse 81   Temp 98 F (36.7 C) (Oral)   Resp 16   Ht 5\' 8"  (1.727 m)   Wt 196 lb 9.6 oz (89.2 kg)   SpO2 98%   BMI 29.89 kg/m  BP Readings from Last 3 Encounters:  04/11/20 138/82  03/18/20 132/74  03/11/20 (!) 134/92   Wt Readings from Last 3 Encounters:  04/11/20 196 lb 9.6 oz (89.2 kg)    03/18/20 198 lb (89.8 kg)  03/11/20 194 lb (88 kg)      Physical Exam Vitals reviewed.  Constitutional:      General: He is not in acute distress.    Appearance: Normal appearance. He is well-developed. He is  not diaphoretic.  HENT:     Head: Normocephalic and atraumatic.     Right Ear: Tympanic membrane, ear canal and external ear normal.     Left Ear: Tympanic membrane, ear canal and external ear normal.  Eyes:     General: No scleral icterus.    Conjunctiva/sclera: Conjunctivae normal.     Pupils: Pupils are equal, round, and reactive to light.  Neck:     Thyroid: No thyromegaly.  Cardiovascular:     Rate and Rhythm: Normal rate and regular rhythm.     Pulses: Normal pulses.     Heart sounds: Normal heart sounds. No murmur heard.   Pulmonary:     Effort: Pulmonary effort is normal. No respiratory distress.     Breath sounds: Normal breath sounds. No wheezing or rales.  Abdominal:     General: There is no distension.     Palpations: Abdomen is soft.     Tenderness: There is no abdominal tenderness.  Musculoskeletal:        General: No deformity.     Cervical back: Neck supple.     Right lower leg: No edema.     Left lower leg: No edema.  Lymphadenopathy:     Cervical: No cervical adenopathy.  Skin:    General: Skin is warm and dry.     Findings: No rash.  Neurological:     Mental Status: He is alert and oriented to person, place, and time. Mental status is at baseline.     Gait: Gait normal.  Psychiatric:        Mood and Affect: Mood normal.        Behavior: Behavior normal.        Thought Content: Thought content normal.       Last depression screening scores PHQ 2/9 Scores 04/11/2020 03/11/2020 04/09/2019  PHQ - 2 Score 0 0 0  PHQ- 9 Score 0 0 0   Last fall risk screening Fall Risk  04/11/2020  Falls in the past year? 0  Number falls in past yr: 0  Injury with Fall? 0  Risk for fall due to : No Fall Risks  Follow up Falls evaluation completed    Last Audit-C alcohol use screening Alcohol Use Disorder Test (AUDIT) 04/11/2020  1. How often do you have a drink containing alcohol? 3  2. How many drinks containing alcohol do you have on a typical day when you are drinking? 2  3. How often do you have six or more drinks on one occasion? 1  AUDIT-C Score 6  4. How often during the last year have you found that you were not able to stop drinking once you had started? 0  5. How often during the last year have you failed to do what was normally expected from you because of drinking? 0  6. How often during the last year have you needed a first drink in the morning to get yourself going after a heavy drinking session? 0  7. How often during the last year have you had a feeling of guilt of remorse after drinking? 0  8. How often during the last year have you been unable to remember what happened the night before because you had been drinking? 0  9. Have you or someone else been injured as a result of your drinking? 0  10. Has a relative or friend or a doctor or another health worker been concerned about your drinking or suggested you cut down? 0  Alcohol Use Disorder Identification Test Final Score (AUDIT) 6  Alcohol Brief Interventions/Follow-up AUDIT Score <7 follow-up not indicated   A score of 3 or more in women, and 4 or more in men indicates increased risk for alcohol abuse, EXCEPT if all of the points are from question 1   No results found for any visits on 04/11/20.  Assessment & Plan    Routine Health Maintenance and Physical Exam  Exercise Activities and Dietary recommendations Goals    . Quit smoking / using tobacco       Immunization History  Administered Date(s) Administered  . Tdap 10/16/2010    Health Maintenance  Topic Date Due  . COVID-19 Vaccine (1) Never done  . INFLUENZA VACCINE  09/08/2020 (Originally 01/10/2020)  . TETANUS/TDAP  10/15/2020  . Hepatitis C Screening  Completed  . HIV Screening  Completed     Discussed health benefits of physical activity, and encouraged him to engage in regular exercise appropriate for his age and condition.  Problem List Items Addressed This Visit      Cardiovascular and Mediastinum   Primary hypertension    Well controlled Continue current medications Reviewed recent metabolic panel F/u in 6 months         Other   Sleep disorder, shift-work    Continue lunesta prn      Overweight    Discussed importance of healthy weight management Discussed diet and exercise        Other Visit Diagnoses    Encounter for annual physical exam    -  Primary   Colon cancer screening       Relevant Orders   Ambulatory referral to Gastroenterology       Return in about 6 months (around 10/09/2020) for chronic disease f/u.     I, Lavon Paganini, MD, have reviewed all documentation for this visit. The documentation on 04/11/20 for the exam, diagnosis, procedures, and orders are all accurate and complete.   Phuong Moffatt, Dionne Bucy, MD, MPH [facility]

## 2020-04-20 ENCOUNTER — Telehealth (INDEPENDENT_AMBULATORY_CARE_PROVIDER_SITE_OTHER): Payer: Self-pay | Admitting: Gastroenterology

## 2020-04-20 DIAGNOSIS — Z1211 Encounter for screening for malignant neoplasm of colon: Secondary | ICD-10-CM

## 2020-04-20 MED ORDER — NA SULFATE-K SULFATE-MG SULF 17.5-3.13-1.6 GM/177ML PO SOLN
1.0000 | Freq: Once | ORAL | 0 refills | Status: AC
Start: 2020-04-20 — End: 2020-04-20

## 2020-04-20 NOTE — Progress Notes (Signed)
Gastroenterology Pre-Procedure Review  Request Date: Thursday 05/19/20 Requesting Physician: Dr. Bonna Gains  PATIENT REVIEW QUESTIONS: The patient responded to the following health history questions as indicated:    1. Are you having any GI issues? no 2. Do you have a personal history of Polyps? no 3. Do you have a family history of Colon Cancer or Polyps? yes (Paternal uncle colon cancer) 4. Diabetes Mellitus? no 5. Joint replacements in the past 12 months?no 6. Major health problems in the past 3 months?no 7. Any artificial heart valves, MVP, or defibrillator?no    MEDICATIONS & ALLERGIES:    Patient reports the following regarding taking any anticoagulation/antiplatelet therapy:   Plavix, Coumadin, Eliquis, Xarelto, Lovenox, Pradaxa, Brilinta, or Effient? no Aspirin? no  Patient confirms/reports the following medications:  Current Outpatient Medications  Medication Sig Dispense Refill  . COD LIVER OIL PO Take by mouth as needed.    . eszopiclone (LUNESTA) 2 MG TABS tablet TAKE 1 TABLET (2 MG TOTAL) BY MOUTH AT BEDTIME AS NEEDED FOR SLEEP. TAKE IMMEDIATELY BEFORE BEDTIME 30 tablet 0  . hydrochlorothiazide (HYDRODIURIL) 12.5 MG tablet TAKE 1 TABLET BY MOUTH EVERY DAY 30 tablet 1  . Multiple Vitamin (MULTIVITAMIN) capsule Take 1 capsule by mouth daily.    . Vitamin D, Ergocalciferol, (DRISDOL) 1.25 MG (50000 UT) CAPS capsule TAKE 1 CAPSULE BY MOUTH ONE TIME PER WEEK 12 capsule 3   No current facility-administered medications for this visit.    Patient confirms/reports the following allergies:  No Known Allergies  No orders of the defined types were placed in this encounter.   AUTHORIZATION INFORMATION Primary Insurance: 1D#: Group #:  Secondary Insurance: 1D#: Group #:  SCHEDULE INFORMATION: Date: Thursday 05/19/20 Time: Location:[facility]

## 2020-04-29 ENCOUNTER — Other Ambulatory Visit: Payer: Self-pay

## 2020-04-29 MED ORDER — NA SULFATE-K SULFATE-MG SULF 17.5-3.13-1.6 GM/177ML PO SOLN
1.0000 | Freq: Once | ORAL | 0 refills | Status: AC
Start: 2020-04-29 — End: 2020-04-29

## 2020-05-09 ENCOUNTER — Encounter: Payer: Self-pay | Admitting: Family Medicine

## 2020-05-09 ENCOUNTER — Ambulatory Visit (INDEPENDENT_AMBULATORY_CARE_PROVIDER_SITE_OTHER): Payer: Managed Care, Other (non HMO) | Admitting: Family Medicine

## 2020-05-09 ENCOUNTER — Other Ambulatory Visit: Payer: Self-pay

## 2020-05-09 DIAGNOSIS — Z23 Encounter for immunization: Secondary | ICD-10-CM

## 2020-05-09 NOTE — Progress Notes (Signed)
Patient here for COVID vaccination only.  I did not examine the patient.  I did review his medical history, medications, and allergies and vaccine consent form.  CMA gave vaccination. Patient tolerated well.  Virginia Crews, MD, MPH [facility] 05/09/2020 11:39 AM

## 2020-05-17 ENCOUNTER — Other Ambulatory Visit: Payer: Managed Care, Other (non HMO)

## 2020-05-19 ENCOUNTER — Encounter: Admission: RE | Payer: Self-pay | Source: Home / Self Care

## 2020-05-19 ENCOUNTER — Ambulatory Visit
Admission: RE | Admit: 2020-05-19 | Payer: Managed Care, Other (non HMO) | Source: Home / Self Care | Admitting: Gastroenterology

## 2020-05-19 SURGERY — COLONOSCOPY WITH PROPOFOL
Anesthesia: General

## 2020-06-10 ENCOUNTER — Other Ambulatory Visit: Payer: Self-pay | Admitting: Family Medicine

## 2020-08-23 ENCOUNTER — Other Ambulatory Visit: Payer: Self-pay | Admitting: Family Medicine

## 2020-08-23 NOTE — Telephone Encounter (Signed)
Requested medication (s) are due for refill today:no  Requested medication (s) are on the active medication list: yes  Last refill: 02/18/2020  Future visit scheduled:Yes  Notes to clinic:  this refill cannot be delegated    Requested Prescriptions  Pending Prescriptions Disp Refills   eszopiclone (LUNESTA) 2 MG TABS tablet [Pharmacy Med Name: ESZOPICLONE 2 MG TABLET] 30 tablet     Sig: TAKE 1 TABLET (2 MG TOTAL) BY MOUTH AT BEDTIME AS NEEDED FOR SLEEP. TAKE IMMEDIATELY BEFORE BEDTIME      Not Delegated - Psychiatry:  Anxiolytics/Hypnotics Failed - 08/23/2020  8:35 AM      Failed - This refill cannot be delegated      Failed - Urine Drug Screen completed in last 360 days      Passed - Valid encounter within last 6 months    Recent Outpatient Visits           3 months ago Encounter for immunization   [place], Dionne Bucy, MD   4 months ago Encounter for annual physical exam   [facility], Dionne Bucy, MD   5 months ago Exposure to trichomonas   [facility] [place], Kelby Aline, FNP   5 months ago Primary hypertension   [facility] [place], Dionne Bucy, MD   1 year ago Encounter for annual physical exam   [facility] Bacigal[place], Dionne Bucy, MD       Future Appointments             In 1 month Bacigalupo, Dionne Bucy, MD [facility] [place]

## 2020-10-13 ENCOUNTER — Ambulatory Visit: Payer: Self-pay | Admitting: Family Medicine

## 2020-10-13 ENCOUNTER — Other Ambulatory Visit: Payer: Self-pay | Admitting: Family Medicine

## 2020-10-13 NOTE — Telephone Encounter (Signed)
[facility] ( NOT on pt's preferred pharmacy list) faxed refill request for the following medications:  eszopiclone (LUNESTA) 2 MG TABS tablet  Please advise. Thanks TNP

## 2020-10-14 MED ORDER — ESZOPICLONE 2 MG PO TABS: 2.0000 mg | ORAL_TABLET | Freq: Every evening | ORAL | 1 refills | Status: DC | PRN

## 2020-12-07 ENCOUNTER — Other Ambulatory Visit: Payer: Self-pay | Admitting: Family Medicine

## 2020-12-07 NOTE — Telephone Encounter (Signed)
   Notes to clinic:  Patient has appt on 12/20/2020 Review for 90 day supply   Requested Prescriptions  Pending Prescriptions Disp Refills   hydrochlorothiazide (HYDRODIURIL) 12.5 MG tablet [Pharmacy Med Name: HYDROCHLOROTHIAZIDE 12.5 MG TB] 90 tablet 1    Sig: TAKE 1 TABLET BY MOUTH EVERY DAY      Cardiovascular: Diuretics - Thiazide Failed - 12/07/2020  1:36 AM      Failed - Cr in normal range and within 360 days    Creatinine, Ser  Date Value Ref Range Status  03/11/2020 0.65 (L) 0.76 - 1.27 mg/dL Final          Failed - Valid encounter within last 6 months    Recent Outpatient Visits           7 months ago Encounter for immunization   [place], Dionne Bucy, MD   8 months ago Encounter for annual physical exam   [facility], Dionne Bucy, MD   8 months ago Exposure to [place], Kelby Aline, FNP   9 months ago Primary hypertension   [facility], Dionne Bucy, MD   1 year ago Encounter for annual physical exam   [facility] [place], Dionne Bucy, MD       Future Appointments             In 1 week Bacigalupo, Dionne Bucy, MD [facility] [place] in normal range and within 360 days    Calcium  Date Value Ref Range Status  03/11/2020 9.1 8.7 - 10.2 mg/dL Final          Passed - K in normal range and within 360 days    Potassium  Date Value Ref Range Status  03/11/2020 3.9 3.5 - 5.2 mmol/L Final          Passed - Na in normal range and within 360 days    Sodium  Date Value Ref Range Status  03/11/2020 136 134 - 144 mmol/L Final          Passed - Last BP in normal range    BP Readings from Last 1 Encounters:  04/11/20 138/82

## 2020-12-20 ENCOUNTER — Encounter: Payer: Self-pay | Admitting: Family Medicine

## 2020-12-20 ENCOUNTER — Other Ambulatory Visit: Payer: Self-pay

## 2020-12-20 ENCOUNTER — Ambulatory Visit (INDEPENDENT_AMBULATORY_CARE_PROVIDER_SITE_OTHER): Payer: Managed Care, Other (non HMO) | Admitting: Family Medicine

## 2020-12-20 VITALS — BP 125/75 | HR 89 | Resp 16 | Ht 68.0 in | Wt 195.8 lb

## 2020-12-20 DIAGNOSIS — E782 Mixed hyperlipidemia: Secondary | ICD-10-CM | POA: Diagnosis not present

## 2020-12-20 DIAGNOSIS — E663 Overweight: Secondary | ICD-10-CM

## 2020-12-20 DIAGNOSIS — I10 Essential (primary) hypertension: Secondary | ICD-10-CM

## 2020-12-20 DIAGNOSIS — R7303 Prediabetes: Secondary | ICD-10-CM | POA: Diagnosis not present

## 2020-12-20 DIAGNOSIS — E559 Vitamin D deficiency, unspecified: Secondary | ICD-10-CM | POA: Diagnosis not present

## 2020-12-20 NOTE — Assessment & Plan Note (Signed)
Continue supplement Recheck Vit D level 

## 2020-12-20 NOTE — Patient Instructions (Signed)

## 2020-12-20 NOTE — Assessment & Plan Note (Signed)
Recommend low carb diet °Recheck A1c  °

## 2020-12-20 NOTE — Progress Notes (Signed)
Established patient visit   Patient: Keith Cohen   DOB: [birth year]   45 y.o. Male  MRN: [id number] Visit Date: 12/20/2020  Today's healthcare provider: Lavon Paganini, MD   Chief Complaint  Patient presents with   Hypertension   Subjective    Hypertension Pertinent negatives include no chest pain, headaches, neck pain, palpitations or shortness of breath.    Hypertension, follow-up  BP Readings from Last 3 Encounters:  12/20/20 125/75  04/11/20 138/82  03/18/20 132/74   Wt Readings from Last 3 Encounters:  12/20/20 195 lb 12.8 oz (88.8 kg)  04/11/20 196 lb 9.6 oz (89.2 kg)  03/18/20 198 lb (89.8 kg)     He was last seen for hypertension 8 months ago.  BP at that visit was 138/82. Management since that visit includes no changes.  He reports excellent compliance with treatment. He is not having side effects.  He is following a Regular, unhealthy  diet.  He has switched to diet soda, wheat bread and low sodium  He is not exercising. He does smoke.  Use of agents associated with hypertension: none.   Outside blood pressures are stable at work. 125/75 He is tolerating 12.5 mg  HCTZ.   Symptoms: No chest pain No chest pressure  No palpitations No syncope  No dyspnea No orthopnea  No paroxysmal nocturnal dyspnea No lower extremity edema   Colon Screening He hasn't scheduled for his colonoscopy because his insurance company only offers 50% coverage.   Pertinent labs: Lab Results  Component Value Date   CHOL 251 (H) 03/08/2020   HDL 47 03/08/2020   LDLCALC 170 (H) 03/08/2020   TRIG 183 (H) 03/08/2020   CHOLHDL 5.3 (H) 03/08/2020   Lab Results  Component Value Date   NA 136 03/11/2020   K 3.9 03/11/2020   CREATININE 0.65 (L) 03/11/2020   GFRNONAA 117 03/11/2020   GFRAA 136 03/11/2020   GLUCOSE 116 (H) 03/11/2020     The 10-year ASCVD risk score Mikey Bussing DC Jr., et al., 2013) is: 12.6%    ---------------------------------------------------------------------------------------------------  Patient Active Problem List   Diagnosis Date Noted   Overweight 04/11/2020   Exposure to trichomonas 03/18/2020   Primary hypertension 03/11/2020   Prediabetes 03/11/2020   Mixed hyperlipidemia 07/04/2018   Avitaminosis D 07/04/2018   Lipoma of torso 03/31/2018   Family history of prostate cancer 03/31/2018   Sleep disorder, shift-work 03/31/2018   Axillary hidradenitis suppurativa 08/19/2014   Social History   Socioeconomic History   Marital status: Divorced    Spouse name: Not on file   Number of children: 0   Years of education: Not on file   Highest education level: Not on file  Occupational History   Not on file  Tobacco Use   Smoking status: Some Days    Packs/day: 0.25    Years: 15.00    Pack years: 3.75    Types: Cigarettes   Smokeless tobacco: Former    Types: Nurse, children's Use: Former  Substance and Sexual Activity   Alcohol use: Yes    Alcohol/week: 10.0 standard drinks    Types: 10 Cans of beer per week   Drug use: No   Sexual activity: Yes    Partners: Female    Birth control/protection: Condom  Other Topics Concern   Not on file  Social History Narrative   Not on file   Social Determinants of Health   Financial Resource Strain: Not  on file  Food Insecurity: Not on file  Transportation Needs: Not on file  Physical Activity: Not on file  Stress: Not on file  Social Connections: Not on file  Intimate Partner Violence: Not on file   No Known Allergies  Medications: Outpatient Medications Prior to Visit  Medication Sig   COD LIVER OIL PO Take by mouth as needed.   eszopiclone (LUNESTA) 2 MG TABS tablet Take 1 tablet (2 mg total) by mouth at bedtime as needed for sleep. Take immediately before bedtime   hydrochlorothiazide (HYDRODIURIL) 12.5 MG tablet TAKE 1 TABLET BY MOUTH EVERY DAY   Multiple Vitamin (MULTIVITAMIN) capsule  Take 1 capsule by mouth daily.   Vitamin D, Ergocalciferol, (DRISDOL) 1.25 MG (50000 UT) CAPS capsule TAKE 1 CAPSULE BY MOUTH ONE TIME PER WEEK   No facility-administered medications prior to visit.   Review of Systems  Constitutional:  Negative for activity change, appetite change, chills, fatigue and fever.  HENT:  Negative for ear pain, sinus pressure, sinus pain and sore throat.   Eyes:  Negative for pain and visual disturbance.  Respiratory:  Negative for cough, chest tightness, shortness of breath and wheezing.   Cardiovascular:  Negative for chest pain, palpitations and leg swelling.  Gastrointestinal:  Negative for abdominal pain, blood in stool, diarrhea, nausea and vomiting.  Genitourinary:  Negative for flank pain, frequency and urgency.  Musculoskeletal:  Negative for back pain, myalgias and neck pain.  Neurological:  Negative for dizziness, weakness, light-headedness, numbness and headaches.       Objective    BP 125/75 Comment: home reading  Pulse 89   Resp 16   Ht 5\' 8"  (1.727 m)   Wt 195 lb 12.8 oz (88.8 kg)   BMI 29.77 kg/m  BP Readings from Last 3 Encounters:  12/20/20 125/75  04/11/20 138/82  03/18/20 132/74   Wt Readings from Last 3 Encounters:  12/20/20 195 lb 12.8 oz (88.8 kg)  04/11/20 196 lb 9.6 oz (89.2 kg)  03/18/20 198 lb (89.8 kg)     Physical Exam Vitals reviewed.  Constitutional:      General: He is not in acute distress.    Appearance: Normal appearance. He is not diaphoretic.  HENT:     Head: Normocephalic and atraumatic.  Eyes:     General: No scleral icterus.    Conjunctiva/sclera: Conjunctivae normal.  Cardiovascular:     Rate and Rhythm: Normal rate and regular rhythm.     Pulses: Normal pulses.     Heart sounds: Normal heart sounds. No murmur heard. Pulmonary:     Effort: Pulmonary effort is normal. No respiratory distress.     Breath sounds: Normal breath sounds. No wheezing or rhonchi.  Abdominal:     General: There is no  distension.     Palpations: Abdomen is soft.     Tenderness: There is no abdominal tenderness.  Musculoskeletal:     Cervical back: Neck supple.     Right lower leg: No edema.     Left lower leg: No edema.  Lymphadenopathy:     Cervical: No cervical adenopathy.  Skin:    General: Skin is warm and dry.     Capillary Refill: Capillary refill takes less than 2 seconds.     Findings: No rash.  Neurological:     Mental Status: He is alert and oriented to person, place, and time.     Cranial Nerves: No cranial nerve deficit.  Psychiatric:  Mood and Affect: Mood normal.        Behavior: Behavior normal.    No results found for any visits on 12/20/20.  Assessment & Plan     Problem List Items Addressed This Visit       Cardiovascular and Mediastinum   Primary hypertension - Primary    Well controlled on home readings Continue current medications Recheck metabolic panel F/u in 6 months        Relevant Orders   Comprehensive metabolic panel     Other   Mixed hyperlipidemia    Reviewed last lipid panel Not currently on a statin Recheck FLP and CMP Discussed diet and exercise        Relevant Orders   Lipid panel   Comprehensive metabolic panel   Avitaminosis D    Continue supplement Recheck Vit D level       Relevant Orders   VITAMIN D 25 Hydroxy (Vit-D Deficiency, Fractures)   Prediabetes    Recommend low carb diet Recheck A1c        Relevant Orders   Hemoglobin A1c   Overweight    Discussed importance of healthy weight management Discussed diet and exercise         Return in about 6 months (around 06/22/2021) for CPE.      I,Essence Turner,acting as a Education administrator for Lavon Paganini, MD.,have documented all relevant documentation on the behalf of Lavon Paganini, MD,as directed by  Lavon Paganini, MD while in the presence of Lavon Paganini, MD.   I, Lavon Paganini, MD, have reviewed all documentation for this visit. The  documentation on 12/20/20 for the exam, diagnosis, procedures, and orders are all accurate and complete.   Xana Bradt, Dionne Bucy, MD, MPH [facility]

## 2020-12-20 NOTE — Assessment & Plan Note (Signed)
Well controlled on home readings Continue current medications Recheck metabolic panel F/u in 6 months  

## 2020-12-20 NOTE — Assessment & Plan Note (Signed)
Discussed importance of healthy weight management Discussed diet and exercise  

## 2020-12-20 NOTE — Assessment & Plan Note (Signed)
Reviewed last lipid panel Not currently on a statin Recheck FLP and CMP Discussed diet and exercise  

## 2021-03-07 ENCOUNTER — Telehealth: Payer: Self-pay

## 2021-03-07 NOTE — Telephone Encounter (Signed)
Patient dropped off wellness form to be filled out on 03/03/22. Form has been reviewed by provider. Per Dr. Brita Romp patient needs to have labs done that were ordered on 12/20/20. Patient advised will come in to get labs done.

## 2021-03-08 ENCOUNTER — Other Ambulatory Visit: Payer: Self-pay | Admitting: Family Medicine

## 2021-03-08 NOTE — Telephone Encounter (Signed)
Patient has been advised to have lad done and they have ben ordered- RF granted

## 2021-03-14 NOTE — Telephone Encounter (Addendum)
Pt came by and had labs done.  Form is on [employer].

## 2021-03-15 LAB — HEMOGLOBIN A1C
Est. average glucose Bld gHb Est-mCnc: 183 mg/dL
Hgb A1c MFr Bld: 8 % — ABNORMAL HIGH (ref 4.8–5.6)

## 2021-03-15 LAB — COMPREHENSIVE METABOLIC PANEL
ALT: 61 IU/L — ABNORMAL HIGH (ref 0–44)
AST: 29 IU/L (ref 0–40)
Albumin/Globulin Ratio: 1.7 (ref 1.2–2.2)
Albumin: 4.7 g/dL (ref 4.0–5.0)
Alkaline Phosphatase: 65 IU/L (ref 44–121)
BUN/Creatinine Ratio: 18 (ref 9–20)
BUN: 13 mg/dL (ref 6–24)
Bilirubin Total: 0.6 mg/dL (ref 0.0–1.2)
CO2: 24 mmol/L (ref 20–29)
Calcium: 10.1 mg/dL (ref 8.7–10.2)
Chloride: 99 mmol/L (ref 96–106)
Creatinine, Ser: 0.73 mg/dL — ABNORMAL LOW (ref 0.76–1.27)
Globulin, Total: 2.8 g/dL (ref 1.5–4.5)
Glucose: 147 mg/dL — ABNORMAL HIGH (ref 70–99)
Potassium: 4.3 mmol/L (ref 3.5–5.2)
Sodium: 142 mmol/L (ref 134–144)
Total Protein: 7.5 g/dL (ref 6.0–8.5)
eGFR: 114 mL/min/{1.73_m2} (ref >59)

## 2021-03-15 LAB — LIPID PANEL
Chol/HDL Ratio: 7.7 ratio — ABNORMAL HIGH (ref 0.0–5.0)
Cholesterol, Total: 338 mg/dL — ABNORMAL HIGH (ref 100–199)
HDL: 44 mg/dL (ref >39)
LDL Chol Calc (NIH): 261 mg/dL — ABNORMAL HIGH (ref 0–99)
Triglycerides: 168 mg/dL — ABNORMAL HIGH (ref 0–149)
VLDL Cholesterol Cal: 33 mg/dL (ref 5–40)

## 2021-03-15 LAB — VITAMIN D 25 HYDROXY (VIT D DEFICIENCY, FRACTURES): Vit D, 25-Hydroxy: 33 ng/mL (ref 30.0–100.0)

## 2021-03-15 NOTE — Progress Notes (Signed)
Patient is no longer prediabetic; prediabetes has progressed to t2dm.  Recommend appt for further instructions.  Elevated cholesterol- elevated fats/triglycerides, elevated bad/LDL. Very high risk for heart attack and stroke given current risk factors.  Elevated liver enzyme- ALT.  Could be elevated for a variety of reasons- ex hydration, alcohol use, tylenol use, etc.  Please let us know if you have any questions.  Thank you,  Tally Joe, FNP

## 2021-06-02 ENCOUNTER — Other Ambulatory Visit: Payer: Self-pay | Admitting: Family Medicine

## 2021-06-02 NOTE — Telephone Encounter (Signed)
Requested Prescriptions  Pending Prescriptions Disp Refills   hydrochlorothiazide (HYDRODIURIL) 12.5 MG tablet [Pharmacy Med Name: HYDROCHLOROTHIAZIDE 12.5 MG TB] 90 tablet 0    Sig: TAKE 1 TABLET BY MOUTH EVERY DAY     Cardiovascular: Diuretics - Thiazide Failed - 06/02/2021  1:41 AM      Failed - Cr in normal range and within 360 days    Creatinine, Ser  Date Value Ref Range Status  03/14/2021 0.73 (L) 0.76 - 1.27 mg/dL Final         Passed - Ca in normal range and within 360 days    Calcium  Date Value Ref Range Status  03/14/2021 10.1 8.7 - 10.2 mg/dL Final         Passed - K in normal range and within 360 days    Potassium  Date Value Ref Range Status  03/14/2021 4.3 3.5 - 5.2 mmol/L Final         Passed - Na in normal range and within 360 days    Sodium  Date Value Ref Range Status  03/14/2021 142 134 - 144 mmol/L Final         Passed - Last BP in normal range    BP Readings from Last 1 Encounters:  12/20/20 125/75         Passed - Valid encounter within last 6 months    Recent Outpatient Visits          5 months ago Primary hypertension   [facility] [place], Dionne Bucy, MD   1 year ago Encounter for immunization   [place], Dionne Bucy, MD   1 year ago Encounter for annual physical exam   [facility] [place], Dionne Bucy, MD   1 year ago Exposure to trichomonas   [place], Kelby Aline, [facility]   1 year ago Primary hypertension   [place], Dionne Bucy, MD      Future Appointments            In 1 month Bacigalupo, Dionne Bucy, MD [facility]

## 2021-07-11 ENCOUNTER — Encounter: Payer: Self-pay | Admitting: Family Medicine

## 2021-07-11 ENCOUNTER — Telehealth: Payer: Self-pay

## 2021-07-11 ENCOUNTER — Other Ambulatory Visit: Payer: Self-pay

## 2021-07-11 ENCOUNTER — Ambulatory Visit (INDEPENDENT_AMBULATORY_CARE_PROVIDER_SITE_OTHER): Payer: Managed Care, Other (non HMO) | Admitting: Family Medicine

## 2021-07-11 VITALS — BP 160/100 | HR 98 | Temp 98.6°F | Resp 16 | Ht 68.0 in | Wt 192.7 lb

## 2021-07-11 DIAGNOSIS — Z23 Encounter for immunization: Secondary | ICD-10-CM

## 2021-07-11 DIAGNOSIS — E1159 Type 2 diabetes mellitus with other circulatory complications: Secondary | ICD-10-CM | POA: Diagnosis not present

## 2021-07-11 DIAGNOSIS — E663 Overweight: Secondary | ICD-10-CM | POA: Diagnosis not present

## 2021-07-11 DIAGNOSIS — I152 Hypertension secondary to endocrine disorders: Secondary | ICD-10-CM

## 2021-07-11 DIAGNOSIS — Z0000 Encounter for general adult medical examination without abnormal findings: Secondary | ICD-10-CM

## 2021-07-11 DIAGNOSIS — E785 Hyperlipidemia, unspecified: Secondary | ICD-10-CM | POA: Diagnosis not present

## 2021-07-11 DIAGNOSIS — Z1211 Encounter for screening for malignant neoplasm of colon: Secondary | ICD-10-CM

## 2021-07-11 DIAGNOSIS — E119 Type 2 diabetes mellitus without complications: Secondary | ICD-10-CM | POA: Insufficient documentation

## 2021-07-11 DIAGNOSIS — E1169 Type 2 diabetes mellitus with other specified complication: Secondary | ICD-10-CM | POA: Diagnosis not present

## 2021-07-11 MED ORDER — NA SULFATE-K SULFATE-MG SULF 17.5-3.13-1.6 GM/177ML PO SOLN: 1.0000 | Freq: Once | ORAL | 0 refills | Status: AC

## 2021-07-11 MED ORDER — HYDROCHLOROTHIAZIDE 25 MG PO TABS: 25.0000 mg | ORAL_TABLET | Freq: Every day | ORAL | 1 refills | Status: DC

## 2021-07-11 NOTE — Progress Notes (Signed)
Complete physical exam   Patient: Keith Cohen   DOB: [birth year]   46 y.o. Male  MRN: [id number] Visit Date: 07/11/2021  Today's healthcare provider: Lavon Paganini, MD   Chief Complaint  Patient presents with   Annual Exam   Subjective    Keith Cohen is a 46 y.o. male who presents today for a complete physical exam.  He reports consuming a  low sodium and high in fiber  diet. Home exercise routine includes walking, cardio and push ups. He generally feels well. He reports sleeping well. He does not have additional problems to discuss today.  HPI    History reviewed. No pertinent past medical history. Past Surgical History:  Procedure Laterality Date   WISDOM TOOTH EXTRACTION  2003   Social History   Socioeconomic History   Marital status: Divorced    Spouse name: Not on file   Number of children: 0   Years of education: Not on file   Highest education level: Not on file  Occupational History   Not on file  Tobacco Use   Smoking status: Some Days    Packs/day: 0.25    Years: 15.00    Pack years: 3.75    Types: Cigarettes   Smokeless tobacco: Former    Types: Chew   Tobacco comments:    Smokes 6 cigs per week.  Vaping Use   Vaping Use: Former  Substance and Sexual Activity   Alcohol use: Yes    Alcohol/week: 10.0 standard drinks    Types: 10 Cans of beer per week   Drug use: No   Sexual activity: Yes    Partners: Female    Birth control/protection: Condom  Other Topics Concern   Not on file  Social History Narrative   Not on file   Social Determinants of Health   Financial Resource Strain: Not on file  Food Insecurity: Not on file  Transportation Needs: Not on file  Physical Activity: Not on file  Stress: Not on file  Social Connections: Not on file  Intimate Partner Violence: Not on file   Family Status  Relation Name Status   Mother  Alive   Father  Alive   PGF  Deceased   MGM  Deceased   MGF  Deceased   PGM   Deceased   Psychiatrist  (Not Specified)   Neg Hx  (Not Specified)   Family History  Problem Relation Age of Onset   Diabetes Mother    Healthy Father    Diabetes Paternal Grandfather    Prostate cancer Paternal Uncle    Colon cancer Neg Hx    Breast cancer Neg Hx    No Known Allergies  Patient Care Team: Willa Brocks, Dionne Bucy, MD as PCP - General (Family Medicine) Bary Castilla, Forest Gleason, MD (General Surgery)   Medications: Outpatient Medications Prior to Visit  Medication Sig   COD LIVER OIL PO Take by mouth as needed.   eszopiclone (LUNESTA) 2 MG TABS tablet Take 1 tablet (2 mg total) by mouth at bedtime as needed for sleep. Take immediately before bedtime   Multiple Vitamin (MULTIVITAMIN) capsule Take 1 capsule by mouth daily.   Vitamin D, Ergocalciferol, (DRISDOL) 1.25 MG (50000 UT) CAPS capsule TAKE 1 CAPSULE BY MOUTH ONE TIME PER WEEK   [DISCONTINUED] hydrochlorothiazide (HYDRODIURIL) 12.5 MG tablet TAKE 1 TABLET BY MOUTH EVERY DAY   No facility-administered medications prior to visit.    Review of Systems  Constitutional: Negative.  HENT: Negative.    Eyes: Negative.   Respiratory: Negative.    Cardiovascular: Negative.   Gastrointestinal: Negative.   Endocrine: Negative.   Genitourinary: Negative.   Musculoskeletal: Negative.   Skin: Negative.   Allergic/Immunologic: Negative.   Neurological: Negative.   Hematological: Negative.   Psychiatric/Behavioral: Negative.       Objective    BP (!) 160/100 (BP Location: Left Arm, Patient Position: Sitting, Cuff Size: Large)    Pulse 98    Temp 98.6 F (37 C) (Oral)    Resp 16    Ht 5\' 8"  (1.727 m)    Wt 192 lb 11.2 oz (87.4 kg)    BMI 29.30 kg/m    Physical Exam Vitals reviewed.  Constitutional:      General: He is not in acute distress.    Appearance: Normal appearance. He is well-developed. He is not diaphoretic.  HENT:     Head: Normocephalic and atraumatic.     Right Ear: Tympanic membrane, ear canal and  external ear normal.     Left Ear: Tympanic membrane, ear canal and external ear normal.     Nose: Nose normal.     Mouth/Throat:     Mouth: Mucous membranes are moist.     Pharynx: Oropharynx is clear. No oropharyngeal exudate.  Eyes:     General: No scleral icterus.    Conjunctiva/sclera: Conjunctivae normal.     Pupils: Pupils are equal, round, and reactive to light.  Neck:     Thyroid: No thyromegaly.  Cardiovascular:     Rate and Rhythm: Normal rate and regular rhythm.     Pulses: Normal pulses.     Heart sounds: Normal heart sounds. No murmur heard. Pulmonary:     Effort: Pulmonary effort is normal. No respiratory distress.     Breath sounds: Normal breath sounds. No wheezing, rhonchi or rales.  Abdominal:     General: There is no distension.     Palpations: Abdomen is soft.     Tenderness: There is no abdominal tenderness.  Musculoskeletal:     Cervical back: Neck supple.     Right lower leg: No edema.     Left lower leg: No edema.  Lymphadenopathy:     Cervical: No cervical adenopathy.  Skin:    General: Skin is warm and dry.     Findings: No rash.  Neurological:     Mental Status: He is alert and oriented to person, place, and time. Mental status is at baseline.     Gait: Gait normal.  Psychiatric:        Mood and Affect: Mood normal.        Behavior: Behavior normal.        Thought Content: Thought content normal.    Diabetic Foot Exam - Simple   Simple Foot Form Diabetic Foot exam was performed with the following findings: Yes 07/11/2021 11:03 AM  Visual Inspection No deformities, no ulcerations, no other skin breakdown bilaterally: Yes Sensation Testing Intact to touch and monofilament testing bilaterally: Yes Pulse Check Posterior Tibialis and Dorsalis pulse intact bilaterally: Yes Comments      Last depression screening scores PHQ 2/9 Scores 07/11/2021 12/20/2020 04/11/2020  PHQ - 2 Score 0 0 0  PHQ- 9 Score - 0 0   Last fall risk screening Fall  Risk  07/11/2021  Falls in the past year? 0  Number falls in past yr: 0  Injury with Fall? 0  Risk for fall due to : -  Follow up -   Last Audit-C alcohol use screening Alcohol Use Disorder Test (AUDIT) 07/11/2021  1. How often do you have a drink containing alcohol? 2  2. How many drinks containing alcohol do you have on a typical day when you are drinking? 1  3. How often do you have six or more drinks on one occasion? 0  AUDIT-C Score 3  4. How often during the last year have you found that you were not able to stop drinking once you had started? 0  5. How often during the last year have you failed to do what was normally expected from you because of drinking? 0  6. How often during the last year have you needed a first drink in the morning to get yourself going after a heavy drinking session? 0  7. How often during the last year have you had a feeling of guilt of remorse after drinking? 0  8. How often during the last year have you been unable to remember what happened the night before because you had been drinking? 0  9. Have you or someone else been injured as a result of your drinking? 0  10. Has a relative or friend or a doctor or another health worker been concerned about your drinking or suggested you cut down? 0  Alcohol Use Disorder Identification Test Final Score (AUDIT) 3  Alcohol Brief Interventions/Follow-up -   A score of 3 or more in women, and 4 or more in men indicates increased risk for alcohol abuse, EXCEPT if all of the points are from question 1   No results found for any visits on 07/11/21.  Assessment & Plan    Routine Health Maintenance and Physical Exam  Exercise Activities and Dietary recommendations  Goals      Quit smoking / using tobacco        Immunization History  Administered Date(s) Administered   PFIZER(Purple Top)SARS-COV-2 Vaccination 04/11/2020, 05/09/2020   Tdap 10/16/2010    Health Maintenance  Topic Date Due   OPHTHALMOLOGY EXAM   Never done   URINE MICROALBUMIN  Never done   COLONOSCOPY (Pts 45-46yrs Insurance coverage will need to be confirmed)  Never done   COVID-19 Vaccine (3 - Booster for Pfizer series) 07/04/2020   TETANUS/TDAP  10/15/2020   INFLUENZA VACCINE  09/08/2021 (Originally 01/09/2021)   HEMOGLOBIN A1C  09/12/2021   FOOT EXAM  07/11/2022   Hepatitis C Screening  Completed   HIV Screening  Completed   HPV VACCINES  Aged Out    Discussed health benefits of physical activity, and encouraged him to engage in regular exercise appropriate for his age and condition.  Problem List Items Addressed This Visit       Cardiovascular and Mediastinum   Hypertension associated with diabetes ([place])    Chronic and uncontrolled Increase HCTZ to 25 mg daily Recheck metabolic panel Follow-up in 3 months      Relevant Medications   hydrochlorothiazide (HYDRODIURIL) 25 MG tablet   Other Relevant Orders   Comprehensive metabolic panel     Endocrine   Hyperlipidemia associated with type 2 diabetes mellitus ([facility])    Significantly elevated on last lipid panel Not currently on statin Has made diet and exercise changes Recheck FLP and CMP Strong consideration for statin pending results      Relevant Medications   hydrochlorothiazide (HYDRODIURIL) 25 MG tablet   Other Relevant Orders   Lipid panel   Comprehensive metabolic panel   Diabetes mellitus ([place])  New diagnosis after last visit with A1c of 8 Patient has worked on diet and exercise changes Not currently on any medications Discussed goal A1c of less than 7 Discussed screenings Foot exam completed today Referral for eye exam placed today Urine microalbumin to creatinine ratio done today Pending repeat A1c, may consider medications Follow-up in 3 months and repeat A1c      Relevant Orders   Hemoglobin A1c   Ambulatory referral to Ophthalmology   Urine Microalbumin w/creat. ratio     Other   Overweight    Discussed importance of healthy  weight management Discussed diet and exercise       Other Visit Diagnoses     Encounter for annual physical exam    -  Primary   Relevant Orders   Hemoglobin A1c   Lipid panel   Comprehensive metabolic panel   Colon cancer screening       Relevant Orders   Ambulatory referral to Gastroenterology   Need for tetanus booster       Relevant Orders   Tdap vaccine greater than or equal to 7yo IM        Return in about 3 months (around 10/08/2021) for chronic disease f/u.     I, Lavon Paganini, MD, have reviewed all documentation for this visit. The documentation on 07/11/21 for the exam, diagnosis, procedures, and orders are all accurate and complete.   Salah Burlison, Dionne Bucy, MD, MPH [facility]

## 2021-07-11 NOTE — Telephone Encounter (Signed)
CALLED PATIENT NO ANSWER LEFT VOICEMAIL FOR A CALL BACK ? ?

## 2021-07-11 NOTE — Assessment & Plan Note (Signed)
Chronic and uncontrolled Increase HCTZ to 25 mg daily Recheck metabolic panel Follow-up in 3 months

## 2021-07-11 NOTE — Assessment & Plan Note (Signed)
Significantly elevated on last lipid panel Not currently on statin Has made diet and exercise changes Recheck FLP and CMP Strong consideration for statin pending results

## 2021-07-11 NOTE — Assessment & Plan Note (Signed)
New diagnosis after last visit with A1c of 8 Patient has worked on diet and exercise changes Not currently on any medications Discussed goal A1c of less than 7 Discussed screenings Foot exam completed today Referral for eye exam placed today Urine microalbumin to creatinine ratio done today Pending repeat A1c, may consider medications Follow-up in 3 months and repeat A1c

## 2021-07-11 NOTE — Progress Notes (Signed)
Gastroenterology Pre-Procedure Review  Request Date: 08/22/2021 Requesting Physician: Dr. Allen Norris  PATIENT REVIEW QUESTIONS: The patient responded to the following health history questions as indicated:    1. Are you having any GI issues? no 2. Do you have a personal history of Polyps? no 3. Do you have a family history of Colon Cancer or Polyps? no 4. Diabetes Mellitus? no 5. Joint replacements in the past 12 months?no 6. Major health problems in the past 3 months?no 7. Any artificial heart valves, MVP, or defibrillator?no    MEDICATIONS & ALLERGIES:    Patient reports the following regarding taking any anticoagulation/antiplatelet therapy:   Plavix, Coumadin, Eliquis, Xarelto, Lovenox, Pradaxa, Brilinta, or Effient? no Aspirin? no  Patient confirms/reports the following medications:  Current Outpatient Medications  Medication Sig Dispense Refill   COD LIVER OIL PO Take by mouth as needed.     eszopiclone (LUNESTA) 2 MG TABS tablet Take 1 tablet (2 mg total) by mouth at bedtime as needed for sleep. Take immediately before bedtime 30 tablet 1   hydrochlorothiazide (HYDRODIURIL) 25 MG tablet Take 1 tablet (25 mg total) by mouth daily. 90 tablet 1   Multiple Vitamin (MULTIVITAMIN) capsule Take 1 capsule by mouth daily.     Vitamin D, Ergocalciferol, (DRISDOL) 1.25 MG (50000 UT) CAPS capsule TAKE 1 CAPSULE BY MOUTH ONE TIME PER WEEK 12 capsule 3   No current facility-administered medications for this visit.    Patient confirms/reports the following allergies:  No Known Allergies  No orders of the defined types were placed in this encounter.   AUTHORIZATION INFORMATION Primary Insurance: 1D#: Group #:  Secondary Insurance: 1D#: Group #:  SCHEDULE INFORMATION: Date: 08/22/2021 Time: Location:[facility]

## 2021-07-11 NOTE — Assessment & Plan Note (Signed)
Discussed importance of healthy weight management Discussed diet and exercise  

## 2021-07-12 ENCOUNTER — Encounter: Payer: Self-pay | Admitting: Family Medicine

## 2021-07-12 LAB — COMPREHENSIVE METABOLIC PANEL
ALT: 41 IU/L (ref 0–44)
AST: 31 IU/L (ref 0–40)
Albumin/Globulin Ratio: 1.7 (ref 1.2–2.2)
Albumin: 4.8 g/dL (ref 4.0–5.0)
Alkaline Phosphatase: 76 IU/L (ref 44–121)
BUN/Creatinine Ratio: 14 (ref 9–20)
BUN: 11 mg/dL (ref 6–24)
Bilirubin Total: 0.5 mg/dL (ref 0.0–1.2)
CO2: 23 mmol/L (ref 20–29)
Calcium: 9.5 mg/dL (ref 8.7–10.2)
Chloride: 98 mmol/L (ref 96–106)
Creatinine, Ser: 0.78 mg/dL (ref 0.76–1.27)
Globulin, Total: 2.8 g/dL (ref 1.5–4.5)
Glucose: 146 mg/dL — ABNORMAL HIGH (ref 70–99)
Potassium: 4.3 mmol/L (ref 3.5–5.2)
Sodium: 139 mmol/L (ref 134–144)
Total Protein: 7.6 g/dL (ref 6.0–8.5)
eGFR: 111 mL/min/{1.73_m2} (ref >59)

## 2021-07-12 LAB — MICROALBUMIN / CREATININE URINE RATIO
Creatinine, Urine: 68.3 mg/dL
Microalb/Creat Ratio: 155 mg/g creat — ABNORMAL HIGH (ref 0–29)
Microalbumin, Urine: 106 ug/mL

## 2021-07-12 LAB — LIPID PANEL
Chol/HDL Ratio: 6.4 ratio — ABNORMAL HIGH (ref 0.0–5.0)
Cholesterol, Total: 255 mg/dL — ABNORMAL HIGH (ref 100–199)
HDL: 40 mg/dL (ref >39)
LDL Chol Calc (NIH): 134 mg/dL — ABNORMAL HIGH (ref 0–99)
Triglycerides: 443 mg/dL — ABNORMAL HIGH (ref 0–149)
VLDL Cholesterol Cal: 81 mg/dL — ABNORMAL HIGH (ref 5–40)

## 2021-07-12 LAB — HEMOGLOBIN A1C
Est. average glucose Bld gHb Est-mCnc: 134 mg/dL
Hgb A1c MFr Bld: 6.3 % — ABNORMAL HIGH (ref 4.8–5.6)

## 2021-07-21 ENCOUNTER — Other Ambulatory Visit: Payer: Self-pay | Admitting: Family Medicine

## 2021-07-21 MED ORDER — ESZOPICLONE 2 MG PO TABS: 2.0000 mg | ORAL_TABLET | Freq: Every evening | ORAL | 0 refills | Status: DC | PRN

## 2021-07-21 NOTE — Telephone Encounter (Signed)
[facility] pharmacy faxed refill request for the following medications:   eszopiclone (LUNESTA) 2 MG TABS tablet  Please advise

## 2021-08-21 ENCOUNTER — Telehealth: Payer: Self-pay | Admitting: Gastroenterology

## 2021-08-21 ENCOUNTER — Telehealth: Payer: Self-pay

## 2021-08-21 NOTE — Telephone Encounter (Signed)
Pt would like instructions for colon prep he did not receive instructions. Procedure is sched for tomorrow. ?

## 2021-08-21 NOTE — Telephone Encounter (Signed)
Patient called and wanted instructions again on how to take the prep med I explained again to him how he needed to take it he understands now and is ready for procedure  ?

## 2021-08-22 ENCOUNTER — Ambulatory Visit: Payer: Managed Care, Other (non HMO) | Admitting: Certified Registered"

## 2021-08-22 ENCOUNTER — Encounter: Payer: Self-pay | Admitting: Gastroenterology

## 2021-08-22 ENCOUNTER — Other Ambulatory Visit: Payer: Self-pay

## 2021-08-22 ENCOUNTER — Encounter
Admission: RE | Disposition: A | Discharge status: Home / Self Care | Payer: Self-pay | Source: Home / Self Care | Attending: Gastroenterology

## 2021-08-22 ENCOUNTER — Ambulatory Visit
Admission: RE | Admit: 2021-08-22 | Discharge: 2021-08-22 | Disposition: A | Discharge status: Home / Self Care | Payer: Managed Care, Other (non HMO) | Attending: Gastroenterology | Admitting: Gastroenterology

## 2021-08-22 DIAGNOSIS — F17210 Nicotine dependence, cigarettes, uncomplicated: Secondary | ICD-10-CM | POA: Diagnosis not present

## 2021-08-22 DIAGNOSIS — Z1211 Encounter for screening for malignant neoplasm of colon: Secondary | ICD-10-CM

## 2021-08-22 DIAGNOSIS — K635 Polyp of colon: Secondary | ICD-10-CM

## 2021-08-22 DIAGNOSIS — K641 Second degree hemorrhoids: Secondary | ICD-10-CM | POA: Insufficient documentation

## 2021-08-22 DIAGNOSIS — D123 Benign neoplasm of transverse colon: Secondary | ICD-10-CM | POA: Diagnosis not present

## 2021-08-22 DIAGNOSIS — K5730 Diverticulosis of large intestine without perforation or abscess without bleeding: Secondary | ICD-10-CM | POA: Diagnosis not present

## 2021-08-22 DIAGNOSIS — I10 Essential (primary) hypertension: Secondary | ICD-10-CM | POA: Insufficient documentation

## 2021-08-22 SURGERY — COLONOSCOPY WITH PROPOFOL
Anesthesia: General

## 2021-08-22 MED ORDER — PROPOFOL 10 MG/ML IV BOLUS
INTRAVENOUS | Status: AC
  Filled 2021-08-22: qty 20

## 2021-08-22 MED ORDER — STERILE WATER FOR IRRIGATION IR SOLN
Status: DC | PRN
  Administered 2021-08-22: 60 mL

## 2021-08-22 MED ORDER — PHENYLEPHRINE HCL (PRESSORS) 10 MG/ML IV SOLN
INTRAVENOUS | Status: AC
  Filled 2021-08-22: qty 1

## 2021-08-22 MED ORDER — PROPOFOL 10 MG/ML IV BOLUS
INTRAVENOUS | Status: DC | PRN
  Administered 2021-08-22 (×3): 20 mg via INTRAVENOUS
  Administered 2021-08-22: 150 mg via INTRAVENOUS
  Administered 2021-08-22: 10 mg via INTRAVENOUS
  Administered 2021-08-22 (×2): 20 mg via INTRAVENOUS

## 2021-08-22 MED ORDER — LIDOCAINE HCL (CARDIAC) PF 100 MG/5ML IV SOSY
PREFILLED_SYRINGE | INTRAVENOUS | Status: DC | PRN
  Administered 2021-08-22: 100 mg via INTRAVENOUS

## 2021-08-22 MED ORDER — SODIUM CHLORIDE 0.9 % IV SOLN: INTRAVENOUS | Status: DC

## 2021-08-22 NOTE — Transfer of Care (Signed)
Immediate Anesthesia Transfer of Care Note ? ?Patient: Keith Cohen ? ?Procedure(s) Performed: COLONOSCOPY WITH PROPOFOL ? ?Patient Location: PACU ? ?Anesthesia Type:General ? ?Level of Consciousness: awake ? ?Airway & Oxygen Therapy: Patient Spontanous Breathing ? ?Post-op Assessment: Report given to RN and Post -op Vital signs reviewed and stable ? ?Post vital signs: Reviewed and stable ? ?Last Vitals:  ?Vitals Value Taken Time  ?BP 126/87 08/22/21 0806  ?Temp 36 ?C 08/22/21 0804  ?Pulse 78 08/22/21 0807  ?Resp 24 08/22/21 0807  ?SpO2 97 % 08/22/21 0807  ?Vitals shown include unvalidated device data. ? ?Last Pain:  ?Vitals:  ? 08/22/21 0804  ?TempSrc:   ?PainSc: 0-No pain  ?   ? ?  ? ?Complications: No notable events documented. ?

## 2021-08-22 NOTE — H&P (Signed)
? ?Lucilla Lame, MD [facility] ?[address] ?[place], [place] [address] ?Phone: [phone] ?Fax : [phone] ? ?Primary Care Physician:  Virginia Crews, MD ?Primary Gastroenterologist:  Dr. Allen Norris ? ?Pre-Procedure History & Physical: ?HPI:  Keith Cohen is a 46 y.o. male is here for a screening colonoscopy.  ? ?History reviewed. No pertinent past medical history. ? ?Past Surgical History:  ?Procedure Laterality Date  ? [place] EXTRACTION  2003  ? ? ?Prior to Admission medications   ?Medication Sig Start Date End Date Taking? Authorizing Provider  ?eszopiclone (LUNESTA) 2 MG TABS tablet Take 1 tablet (2 mg total) by mouth at bedtime as needed for sleep. Take immediately before bedtime 07/21/21  Yes Gwyneth Sprout, FNP  ?hydrochlorothiazide (HYDRODIURIL) 25 MG tablet Take 1 tablet (25 mg total) by mouth daily. 07/11/21  Yes Bacigalupo, Dionne Bucy, MD  ?Multiple Vitamin (MULTIVITAMIN) capsule Take 1 capsule by mouth daily.   Yes [provider]  ?Vitamin D, Ergocalciferol, (DRISDOL) 1.25 MG (50000 UT) CAPS capsule TAKE 1 CAPSULE BY MOUTH ONE TIME PER WEEK 05/28/19  Yes Bacigalupo, Dionne Bucy, MD  ?COD LIVER OIL PO Take by mouth as needed.    [provider]  ? ? ?Allergies as of 07/12/2021  ? (No Known Allergies)  ? ? ?Family History  ?Problem Relation Age of Onset  ? Diabetes Mother   ? Healthy Father   ? Diabetes Paternal Grandfather   ? Prostate cancer Paternal Uncle   ? Colon cancer Neg Hx   ? Breast cancer Neg Hx   ? ? ?Social History  ? ?Socioeconomic History  ? Marital status: Divorced  ?  Spouse name: Not on file  ? Number of children: 0  ? Years of education: Not on file  ? Highest education level: Not on file  ?Occupational History  ? Not on file  ?Tobacco Use  ? Smoking status: Some Days  ?  Packs/day: 0.25  ?  Years: 15.00  ?  Pack years: 3.75  ?  Types: Cigarettes  ? Smokeless tobacco: Former  ?  Types: Chew  ? Tobacco comments:  ?  Smokes 6 cigs per week.  ?Vaping Use   ? Vaping Use: Some days  ?Substance and Sexual Activity  ? Alcohol use: Yes  ?  Alcohol/week: 10.0 standard drinks  ?  Types: 10 Cans of beer per week  ? Drug use: No  ? Sexual activity: Yes  ?  Partners: Female  ?  Birth control/protection: Condom  ?Other Topics Concern  ? Not on file  ?Social History Narrative  ? Not on file  ? ?Social Determinants of Health  ? ?Financial Resource Strain: Not on file  ?Food Insecurity: Not on file  ?Transportation Needs: Not on file  ?Physical Activity: Not on file  ?Stress: Not on file  ?Social Connections: Not on file  ?Intimate Partner Violence: Not on file  ? ? ?Review of Systems: ?See HPI, otherwise negative ROS ? ?Physical Exam: ?BP (!) 159/110   Pulse 91   Temp (!) 97.1 ?F (36.2 ?C) (Temporal)   Resp 16   Ht '5\' 8"'$  (1.727 m)   Wt 86.2 kg   SpO2 97%   BMI 28.89 kg/m?  ?General:   Alert,  pleasant and cooperative in NAD ?Head:  Normocephalic and atraumatic. ?Neck:  Supple; no masses or thyromegaly. ?Lungs:  Clear throughout to auscultation.    ?Heart:  Regular rate and rhythm. ?Abdomen:  Soft, nontender and nondistended. Normal bowel  sounds, without guarding, and without rebound.   ?Neurologic:  Alert and  oriented x4;  grossly normal neurologically. ? ?Impression/Plan: ?Keith Cohen is now here to undergo a screening colonoscopy. ? ?Risks, benefits, and alternatives regarding colonoscopy have been reviewed with the patient.  Questions have been answered.  All parties agreeable. ?

## 2021-08-22 NOTE — Op Note (Signed)
[facility] ?Gastroenterology ?Patient Name: Keith Cohen ?Procedure Date: 08/22/2021 7:42 AM ?MRN: [id number] ?Account #: [id number] ?Date of Birth: 1975/06/17 ?Admit Type: Outpatient ?Age: 46 ?Room: [facility] ENDO ROOM 4 ?Gender: Male ?Note Status: Finalized ?Instrument Name: Colonoscope [id number] ?Procedure:             Colonoscopy ?Indications:           Screening for colorectal malignant neoplasm ?Providers:             Lucilla Lame MD, MD ?Referring MD:          Dionne Bucy. Bacigalupo (Referring MD) ?Medicines:             Propofol per Anesthesia ?Complications:         No immediate complications. ?Procedure:             Pre-Anesthesia Assessment: ?                       - Prior to the procedure, a History and Physical was  ?                       performed, and patient medications and allergies were  ?                       reviewed. The patient's tolerance of previous  ?                       anesthesia was also reviewed. The risks and benefits  ?                       of the procedure and the sedation options and risks  ?                       were discussed with the patient. All questions were  ?                       answered, and informed consent was obtained. Prior  ?                       Anticoagulants: The patient has taken no previous  ?                       anticoagulant or antiplatelet agents. ASA Grade  ?                       Assessment: II - A patient with mild systemic disease.  ?                       After reviewing the risks and benefits, the patient  ?                       was deemed in satisfactory condition to undergo the  ?                       procedure. ?                       After obtaining informed consent, the colonoscope was  ?  passed under direct vision. Throughout the procedure,  ?                       the patient's blood pressure, pulse, and oxygen  ?                       saturations were monitored continuously. The  ?                        Colonoscope was introduced through the anus and  ?                       advanced to the the cecum, identified by appendiceal  ?                       orifice and ileocecal valve. The colonoscopy was  ?                       performed without difficulty. The patient tolerated  ?                       the procedure well. The quality of the bowel  ?                       preparation was excellent. ?Findings: ?     The perianal and digital rectal examinations were normal. ?     A 5 mm polyp was found in the transverse colon. The polyp was sessile.  ?     The polyp was removed with a cold snare. Resection and retrieval were  ?     complete. ?     Non-bleeding internal hemorrhoids were found during retroflexion. The  ?     hemorrhoids were Grade II (internal hemorrhoids that prolapse but reduce  ?     spontaneously). ?     Multiple small-mouthed diverticula were found in the sigmoid colon,  ?     descending colon and transverse colon. ?Impression:            - One 5 mm polyp in the transverse colon, removed with  ?                       a cold snare. Resected and retrieved. ?                       - Non-bleeding internal hemorrhoids. ?                       - Diverticulosis in the sigmoid colon, in the  ?                       descending colon and in the transverse colon. ?Recommendation:        - Discharge patient to home. ?                       - Resume previous diet. ?                       - Continue present medications. ?                       -  Await pathology results. ?                       - If the pathology report reveals adenomatous tissue,  ?                       then repeat the colonoscopy for surveillance in 7  ?                       years. ?Procedure Code(s):     --- Professional --- ?                       [phone], Colonoscopy, flexible; with removal of  ?                       tumor(s), polyp(s), or other lesion(s) by snare  ?                       technique ?Diagnosis Code(s):     --- Professional  --- ?                       Z12.11, Encounter for screening for malignant neoplasm  ?                       of colon ?                       K63.5, Polyp of colon ?CPT copyright 2019 American Medical Association. All rights reserved. ?The codes documented in this report are preliminary and upon coder review may  ?be revised to meet current compliance requirements. ?Lucilla Lame MD, MD ?08/22/2021 8:03:45 AM ?This report has been signed electronically. ?Number of Addenda: 0 ?Note Initiated On: 08/22/2021 7:42 AM ?Scope Withdrawal Time: 0 hours 9 minutes 3 seconds  ?Total Procedure Duration: 0 hours 12 minutes 19 seconds  ?Estimated Blood Loss:  Estimated blood loss: none. ?     [facility] ?

## 2021-08-22 NOTE — Anesthesia Postprocedure Evaluation (Signed)
Anesthesia Post Note ? ?Patient: Keith Cohen ? ?Procedure(s) Performed: COLONOSCOPY WITH PROPOFOL ? ?Patient location during evaluation: PACU ?Anesthesia Type: General ?Level of consciousness: awake and alert, oriented and patient cooperative ?Pain management: pain level controlled ?Vital Signs Assessment: post-procedure vital signs reviewed and stable ?Respiratory status: spontaneous breathing, nonlabored ventilation and respiratory function stable ?Cardiovascular status: blood pressure returned to baseline and stable ?Postop Assessment: adequate PO intake ?Anesthetic complications: no ? ? ?No notable events documented. ? ? ?Last Vitals:  ?Vitals:  ? 08/22/21 0814 08/22/21 0824  ?BP: (!) 131/91 (!) 130/100  ?Pulse: 76 72  ?Resp: 12 (!) 21  ?Temp:    ?SpO2: 97% 96%  ?  ?Last Pain:  ?Vitals:  ? 08/22/21 0814  ?TempSrc:   ?PainSc: 0-No pain  ? ? ?  ?  ?  ?  ?  ?  ? ?Darrin Nipper ? ? ? ? ?

## 2021-08-22 NOTE — Anesthesia Preprocedure Evaluation (Signed)
Anesthesia Evaluation  ?Patient identified by MRN, date of birth, ID band ?Patient awake ? ? ? ?Reviewed: ?Allergy & Precautions, NPO status , Patient's Chart, lab work & pertinent test results ? ?History of Anesthesia Complications ?Negative for: history of anesthetic complications ? ?Airway ?Mallampati: III ? ? ?Neck ROM: Full ? ? ? Dental ?no notable dental hx. ? ?  ?Pulmonary ?Current Smoker (occasional cigarette) and Patient abstained from smoking.,  ?  ?Pulmonary exam normal ?breath sounds clear to auscultation ? ? ? ? ? ? Cardiovascular ?hypertension, Normal cardiovascular exam ?Rhythm:Regular Rate:Normal ? ? ?  ?Neuro/Psych ?negative neurological ROS ?   ? GI/Hepatic ?negative GI ROS,   ?Endo/Other  ?negative endocrine ROS ? Renal/GU ?negative Renal ROS  ? ?  ?Musculoskeletal ? ? Abdominal ?  ?Peds ? Hematology ?negative hematology ROS ?(+)   ?Anesthesia Other Findings ? ? Reproductive/Obstetrics ? ?  ? ? ? ? ? ? ? ? ? ? ? ? ? ?  ?  ? ? ? ? ? ? ? ? ?Anesthesia Physical ?Anesthesia Plan ? ?ASA: 2 ? ?Anesthesia Plan: General  ? ?Post-op Pain Management:   ? ?Induction: Intravenous ? ?PONV Risk Score and Plan: 1 and Propofol infusion, TIVA and Treatment may vary due to age or medical condition ? ?Airway Management Planned: Natural Airway ? ?Additional Equipment:  ? ?Intra-op Plan:  ? ?Post-operative Plan:  ? ?Informed Consent: I have reviewed the patients History and Physical, chart, labs and discussed the procedure including the risks, benefits and alternatives for the proposed anesthesia with the patient or authorized representative who has indicated his/her understanding and acceptance.  ? ? ? ? ? ?Plan Discussed with: CRNA ? ?Anesthesia Plan Comments: (LMA/GETA backup discussed.  Patient consented for risks of anesthesia including but not limited to:  ?- adverse reactions to medications ?- damage to eyes, teeth, lips or other oral mucosa ?- nerve damage due to positioning   ?- sore throat or hoarseness ?- damage to heart, brain, nerves, lungs, other parts of body or loss of life ? ?Informed patient about role of CRNA in peri- and intra-operative care.  Patient voiced understanding.)  ? ? ? ? ? ? ?Anesthesia Quick Evaluation ? ?

## 2021-08-23 ENCOUNTER — Encounter: Payer: Self-pay | Admitting: Gastroenterology

## 2021-08-23 LAB — SURGICAL PATHOLOGY

## 2021-08-28 ENCOUNTER — Encounter: Payer: Self-pay | Admitting: Gastroenterology

## 2021-09-20 ENCOUNTER — Other Ambulatory Visit: Payer: Self-pay | Admitting: Family Medicine

## 2021-10-11 NOTE — Progress Notes (Signed)
?  ? ?I,Sulibeya S Dimas,acting as a scribe for Lavon Paganini, MD.,have documented all relevant documentation on the behalf of Lavon Paganini, MD,as directed by  Lavon Paganini, MD while in the presence of Lavon Paganini, MD. ? ? ?Established patient visit ? ? ?Patient: Keith Cohen   DOB: [birth year]   46 y.o. Male  MRN: [id number] ?Visit Date: 10/13/2021 ? ?Today's healthcare provider: Lavon Paganini, MD  ? ?Chief Complaint  ?Patient presents with  ? Diabetes  ? Hypertension  ? Hyperlipidemia  ? ?Subjective  ?  ?HPI  ?Diabetes Mellitus Type II, follow-up ? ?Lab Results  ?Component Value Date  ? HGBA1C 5.5 10/13/2021  ? HGBA1C 6.3 (H) 07/11/2021  ? HGBA1C 8.0 (H) 03/14/2021  ? ?Last seen for diabetes 3 months ago.  ?Management since then includes diet controlled diabetes. No need for medications at this time. ?He reports excellent compliance with treatment. ?He is not having side effects.  ? ?Home blood sugar records:  not being checked ? ?Episodes of hypoglycemia? No  ?  ?Current insulin regiment: none ?Most Recent Eye Exam:  ? ?--------------------------------------------------------------------------------------------------- ?Hypertension, follow-up ? ?BP Readings from Last 3 Encounters:  ?10/13/21 136/80  ?08/22/21 (!) 130/100  ?07/11/21 (!) 160/100  ? Wt Readings from Last 3 Encounters:  ?10/13/21 188 lb 12.8 oz (85.6 kg)  ?08/22/21 190 lb (86.2 kg)  ?07/11/21 192 lb 11.2 oz (87.4 kg)  ?  ? ?He was last seen for hypertension 3 months ago.  ?BP at that visit was increase HCTZ to 66m daily. ?Management since that visit includes no changes. ?He reports excellent compliance with treatment. ?He is not having side effects.  ?He is exercising. ?He is adherent to low salt diet.   ?Outside blood pressures are stable. ? ?He does smoke. ? ?Use of agents associated with hypertension: none.   ? ?--------------------------------------------------------------------------------------------------- ?Lipid/Cholesterol, follow-up ? ?Last Lipid Panel: ?Lab Results  ?Component Value Date  ? CHOL 255 (H) 07/11/2021  ? LDLCALC 134 (H) 07/11/2021  ? HDL 40 07/11/2021  ? TRIG 443 (H) 07/11/2021  ? ? ?He was last seen for this 3 months ago.  ?Management since that visit includes recommend start Crestor 520mdaily. ?Patient did not start any new medications and requested to wait until fu.  ? ?He reports excellent compliance with treatment. ?He is not having side effects.  ? ?Symptoms: ?No appetite changes No foot ulcerations  ?No chest pain No chest pressure/discomfort  ?No dyspnea No orthopnea  ?No fatigue No lower extremity edema  ?No palpitations No paroxysmal nocturnal dyspnea  ?No nausea No numbness or tingling of extremity  ?No polydipsia No polyuria  ?No speech difficulty No syncope  ? ?He is following a Regular, Low Sodium diet. ?Current exercise: cardiovascular workout on exercise equipment and walking ? ?Last metabolic panel ?Lab Results  ?Component Value Date  ? GLUCOSE 146 (H) 07/11/2021  ? NA 139 07/11/2021  ? K 4.3 07/11/2021  ? BUN 11 07/11/2021  ? CREATININE 0.78 07/11/2021  ? EGFR 111 07/11/2021  ? GFRNONAA 117 03/11/2020  ? CALCIUM 9.5 07/11/2021  ? AST 31 07/11/2021  ? ALT 41 07/11/2021  ? ?The 10-year ASCVD risk score (Arnett DK, et al., 2019) is: 27.3% ? ? ?Has made a lot of diet and exercise changes. ?--------------------------------------------------------------------------------------------------- ? ? ?Medications: ?Outpatient Medications Prior to Visit  ?Medication Sig  ? COD LIVER OIL PO Take by mouth as needed.  ? eszopiclone (LUNESTA) 2 MG TABS tablet Take 1 tablet (2 mg total) by  mouth at bedtime as needed for sleep. Take immediately before bedtime  ? hydrochlorothiazide (HYDRODIURIL) 25 MG tablet Take 1 tablet (25 mg total) by mouth daily.  ? Multiple Vitamin (MULTIVITAMIN) capsule Take 1  capsule by mouth daily.  ? [DISCONTINUED] Vitamin D, Ergocalciferol, (DRISDOL) 1.25 MG (50000 UT) CAPS capsule TAKE 1 CAPSULE BY MOUTH ONE TIME PER WEEK (Patient not taking: Reported on 10/13/2021)  ? ?No facility-administered medications prior to visit.  ? ? ?Review of Systems per HPI ? ?  ?  Objective  ?  ?BP 136/80 (BP Location: Left Arm, Patient Position: Sitting, Cuff Size: Large)   Pulse 67   Temp 98.9 ?F (37.2 ?C) (Oral)   Resp 16   Ht '5\' 8"'  (1.727 m)   Wt 188 lb 12.8 oz (85.6 kg)   SpO2 98%   BMI 28.71 kg/m?  ?  ? ?Physical Exam ?Vitals reviewed.  ?Constitutional:   ?   General: He is not in acute distress. ?   Appearance: Normal appearance. He is not diaphoretic.  ?HENT:  ?   Head: Normocephalic and atraumatic.  ?Eyes:  ?   General: No scleral icterus. ?   Conjunctiva/sclera: Conjunctivae normal.  ?Cardiovascular:  ?   Rate and Rhythm: Normal rate and regular rhythm.  ?   Pulses: Normal pulses.  ?   Heart sounds: Normal heart sounds. No murmur heard. ?Pulmonary:  ?   Effort: Pulmonary effort is normal. No respiratory distress.  ?   Breath sounds: Normal breath sounds. No wheezing or rhonchi.  ?Musculoskeletal:  ?   Cervical back: Neck supple.  ?   Right lower leg: No edema.  ?   Left lower leg: No edema.  ?Lymphadenopathy:  ?   Cervical: No cervical adenopathy.  ?Skin: ?   General: Skin is warm and dry.  ?Neurological:  ?   Mental Status: He is alert and oriented to person, place, and time. Mental status is at baseline.  ?Psychiatric:     ?   Mood and Affect: Mood normal.     ?   Behavior: Behavior normal.  ?  ? ? ?Results for orders placed or performed in visit on 10/13/21  ?POCT glycosylated hemoglobin (Hb A1C)  ?Result Value Ref Range  ? Hemoglobin A1C 5.5 4.0 - 5.6 %  ? Est. average glucose Bld gHb Est-mCnc 111   ? ? Assessment & Plan  ?  ? ?Problem List Items Addressed This Visit   ? ?  ? Cardiovascular and Mediastinum  ? Hypertension associated with diabetes ([place])  ?  Well controlled ?Continue  current medications ?Recheck metabolic panel ?  ?  ? Relevant Orders  ? Comprehensive metabolic panel  ?  ? Endocrine  ? Hyperlipidemia associated with type 2 diabetes mellitus ([place])  ?  Improving on last lipid panel, but not quite to goal in the setting of diabetes ?Not currently on statin ?Has made significant diet and exercise changes ?Recheck FLP and CMP ? ?  ?  ? Relevant Orders  ? Comprehensive metabolic panel  ? Lipid panel  ? Diabetes mellitus ([place]) - Primary  ?  Chronic and well-controlled ?Has made significant diet and exercise changes ?Not currently on any medications ?Goal A1c less than 7 ?Discussed screenings ?We will schedule eye exam ? ?  ?  ? Relevant Orders  ? POCT glycosylated hemoglobin (Hb A1C) (Completed)  ? Urine Microalbumin w/creat. ratio  ? Microalbuminuria due to type 2 diabetes mellitus ([place])  ?  New diagnosis after  last visit ?Patient did not want to start ACE/ARB at that time ?We discussed the significance of this ?He has made good improvements in BP, diabetes control, diet, exercise, so we will recheck today ?If still elevated, will again recommend low-dose ACE/ARB ? ?  ?  ? Relevant Orders  ? Urine Microalbumin w/creat. ratio  ?  ? ?Return in about 6 months (around 04/15/2022) for chronic disease f/u.  ?   ? ?I, Lavon Paganini, MD, have reviewed all documentation for this visit. The documentation on 10/13/21 for the exam, diagnosis, procedures, and orders are all accurate and complete. ? ? ?Virginia Crews, MD, MPH ?[place] ?[facility]   ?

## 2021-10-13 ENCOUNTER — Ambulatory Visit (INDEPENDENT_AMBULATORY_CARE_PROVIDER_SITE_OTHER): Payer: Managed Care, Other (non HMO) | Admitting: Family Medicine

## 2021-10-13 ENCOUNTER — Encounter: Payer: Self-pay | Admitting: Family Medicine

## 2021-10-13 VITALS — BP 136/80 | HR 67 | Temp 98.9°F | Resp 16 | Ht 68.0 in | Wt 188.8 lb

## 2021-10-13 DIAGNOSIS — R809 Proteinuria, unspecified: Secondary | ICD-10-CM

## 2021-10-13 DIAGNOSIS — E1169 Type 2 diabetes mellitus with other specified complication: Secondary | ICD-10-CM

## 2021-10-13 DIAGNOSIS — E1159 Type 2 diabetes mellitus with other circulatory complications: Secondary | ICD-10-CM

## 2021-10-13 DIAGNOSIS — E1129 Type 2 diabetes mellitus with other diabetic kidney complication: Secondary | ICD-10-CM | POA: Diagnosis not present

## 2021-10-13 DIAGNOSIS — I152 Hypertension secondary to endocrine disorders: Secondary | ICD-10-CM

## 2021-10-13 DIAGNOSIS — E785 Hyperlipidemia, unspecified: Secondary | ICD-10-CM

## 2021-10-13 LAB — POCT GLYCOSYLATED HEMOGLOBIN (HGB A1C)
Est. average glucose Bld gHb Est-mCnc: 111
Hemoglobin A1C: 5.5 % (ref 4.0–5.6)

## 2021-10-13 NOTE — Assessment & Plan Note (Signed)
Improving on last lipid panel, but not quite to goal in the setting of diabetes ?Not currently on statin ?Has made significant diet and exercise changes ?Recheck FLP and CMP ?

## 2021-10-13 NOTE — Assessment & Plan Note (Signed)
Chronic and well-controlled ?Has made significant diet and exercise changes ?Not currently on any medications ?Goal A1c less than 7 ?Discussed screenings ?We will schedule eye exam ?

## 2021-10-13 NOTE — Assessment & Plan Note (Signed)
New diagnosis after last visit ?Patient did not want to start ACE/ARB at that time ?We discussed the significance of this ?He has made good improvements in BP, diabetes control, diet, exercise, so we will recheck today ?If still elevated, will again recommend low-dose ACE/ARB ?

## 2021-10-13 NOTE — Assessment & Plan Note (Signed)
Well controlled Continue current medications Recheck metabolic panel 

## 2021-10-14 LAB — COMPREHENSIVE METABOLIC PANEL
ALT: 40 IU/L (ref 0–44)
AST: 29 IU/L (ref 0–40)
Albumin/Globulin Ratio: 1.6 (ref 1.2–2.2)
Albumin: 4.7 g/dL (ref 4.0–5.0)
Alkaline Phosphatase: 57 IU/L (ref 44–121)
BUN/Creatinine Ratio: 15 (ref 9–20)
BUN: 13 mg/dL (ref 6–24)
Bilirubin Total: 1 mg/dL (ref 0.0–1.2)
CO2: 24 mmol/L (ref 20–29)
Calcium: 9.4 mg/dL (ref 8.7–10.2)
Chloride: 95 mmol/L — ABNORMAL LOW (ref 96–106)
Creatinine, Ser: 0.86 mg/dL (ref 0.76–1.27)
Globulin, Total: 2.9 g/dL (ref 1.5–4.5)
Glucose: 141 mg/dL — ABNORMAL HIGH (ref 70–99)
Potassium: 3.9 mmol/L (ref 3.5–5.2)
Sodium: 136 mmol/L (ref 134–144)
Total Protein: 7.6 g/dL (ref 6.0–8.5)
eGFR: 108 mL/min/{1.73_m2} (ref >59)

## 2021-10-14 LAB — MICROALBUMIN / CREATININE URINE RATIO
Creatinine, Urine: 42.4 mg/dL
Microalb/Creat Ratio: 65 mg/g creat — ABNORMAL HIGH (ref 0–29)
Microalbumin, Urine: 27.7 ug/mL

## 2021-10-14 LAB — LIPID PANEL
Chol/HDL Ratio: 8.5 ratio — ABNORMAL HIGH (ref 0.0–5.0)
Cholesterol, Total: 229 mg/dL — ABNORMAL HIGH (ref 100–199)
HDL: 27 mg/dL — ABNORMAL LOW (ref >39)
LDL Chol Calc (NIH): 185 mg/dL — ABNORMAL HIGH (ref 0–99)
Triglycerides: 94 mg/dL (ref 0–149)
VLDL Cholesterol Cal: 17 mg/dL (ref 5–40)

## 2021-11-15 ENCOUNTER — Telehealth: Payer: Self-pay | Admitting: Family Medicine

## 2021-11-15 NOTE — Telephone Encounter (Signed)
[facility] faxed refill request for the following medications:  eszopiclone (LUNESTA) 2 MG TABS tablet  Please advise.

## 2021-11-16 MED ORDER — ESZOPICLONE 2 MG PO TABS
2.0000 mg | ORAL_TABLET | Freq: Every evening | ORAL | 0 refills | Status: DC | PRN
Start: 2021-11-16 — End: 2022-10-18

## 2021-11-16 NOTE — Telephone Encounter (Signed)
Prescription sent in today to patient's pharmacy by provider

## 2022-01-05 ENCOUNTER — Other Ambulatory Visit: Payer: Self-pay

## 2022-01-05 ENCOUNTER — Telehealth: Payer: Self-pay | Admitting: Family Medicine

## 2022-01-05 MED ORDER — HYDROCHLOROTHIAZIDE 25 MG PO TABS: 25.0000 mg | ORAL_TABLET | Freq: Every day | ORAL | 1 refills | Status: DC

## 2022-01-05 NOTE — Telephone Encounter (Signed)
Refill sent. Pt informed.  KP

## 2022-01-05 NOTE — Telephone Encounter (Signed)
[facility] faxed refill request for the following medications:  hydrochlorothiazide (HYDRODIURIL) 25 MG tablet   Please advise.

## 2022-01-06 ENCOUNTER — Other Ambulatory Visit: Payer: Self-pay | Admitting: Family Medicine

## 2022-01-08 NOTE — Telephone Encounter (Signed)
Requested medication (s) are due for refill today: no  Requested medication (s) are on the active medication list:yes  Last refill:  01/05/22  Future visit scheduled:yes  Notes to clinic:  Unable to refill per protocol, last refill by provider on 01/05/22 for 90 days and 1 refill.     Requested Prescriptions  Pending Prescriptions Disp Refills   hydrochlorothiazide (HYDRODIURIL) 25 MG tablet [Pharmacy Med Name: HYDROCHLOROTHIAZIDE 25 MG TAB] 90 tablet 1    Sig: Take 1 tablet (25 mg total) by mouth daily.     Cardiovascular: Diuretics - Thiazide Passed - 01/06/2022  1:16 AM      Passed - Cr in normal range and within 180 days    Creatinine, Ser  Date Value Ref Range Status  10/13/2021 0.86 0.76 - 1.27 mg/dL Final         Passed - K in normal range and within 180 days    Potassium  Date Value Ref Range Status  10/13/2021 3.9 3.5 - 5.2 mmol/L Final         Passed - Na in normal range and within 180 days    Sodium  Date Value Ref Range Status  10/13/2021 136 134 - 144 mmol/L Final         Passed - Last BP in normal range    BP Readings from Last 1 Encounters:  10/13/21 136/80         Passed - Valid encounter within last 6 months    Recent Outpatient Visits           2 months ago Type 2 diabetes mellitus with microalbuminuria, without long-term current use of insulin [facility])   [facility] [place], Dionne Bucy, MD   6 months ago Encounter for annual physical exam   [facility] [place], Dionne Bucy, MD   1 year ago Primary hypertension   [place], Dionne Bucy, MD   1 year ago Encounter for immunization   [place], Dionne Bucy, MD   1 year ago Encounter for annual physical exam   [facility] [place], Dionne Bucy, MD       Future Appointments             In 3 months Bac[place]al[place], Dionne Bucy, MD [facility] [place]

## 2022-04-16 NOTE — Progress Notes (Unsigned)
I,Joseline E Rosas,acting as a scribe for Lavon Paganini, MD.,have documented all relevant documentation on the behalf of Lavon Paganini, MD,as directed by  Lavon Paganini, MD while in the presence of Lavon Paganini, MD.   Established patient visit   Patient: Keith Cohen   DOB: 07/15/1975   46 y.o. Male  MRN: [id number] Visit Date: 04/17/2022  Today's healthcare provider: Lavon Paganini, MD   Chief Complaint  Patient presents with   Follow-Up Chronic Disease   Subjective    HPI  Diabetes Mellitus Type II, follow-up  Lab Results  Component Value Date   HGBA1C 5.9 (A) 04/17/2022   HGBA1C 5.5 10/13/2021   HGBA1C 6.3 (H) 07/11/2021   Last seen for diabetes 6 months ago.  Management since then includes continuing the same treatment. He reports excellent compliance with treatment. He is not having side effects.   Home blood sugar records:  not being checked  Episodes of hypoglycemia? No    Current insulin regiment: none Most Recent Eye Exam: UTD  --------------------------------------------------------------------------------------------------- Hypertension, follow-up  BP Readings from Last 3 Encounters:  04/17/22 (!) 140/90  10/13/21 136/80  08/22/21 (!) 130/100   Wt Readings from Last 3 Encounters:  04/17/22 187 lb 12.8 oz (85.2 kg)  10/13/21 188 lb 12.8 oz (85.6 kg)  08/22/21 190 lb (86.2 kg)     He was last seen for hypertension 6 months ago.  BP at that visit was 136/80. Management since that visit includes no changes. He reports excellent compliance with treatment. He is not having side effects.  He is exercising.Weights, yoga He is adherent to low salt diet.   Outside blood pressures are not being checked.  He does not smoke.  Use of agents associated with hypertension: none.   --------------------------------------------------------------------------------------------------- Lipid/Cholesterol, follow-up  Last Lipid  Panel: Lab Results  Component Value Date   CHOL 229 (H) 10/13/2021   LDLCALC 185 (H) 10/13/2021   HDL 27 (L) 10/13/2021   TRIG 94 10/13/2021    He was last seen for this 6 months ago.  Management since that visit includes no changes. Not currently on statin.  Symptoms: No appetite changes No foot ulcerations  No chest pain No chest pressure/discomfort  No dyspnea No orthopnea  No fatigue No lower extremity edema  No palpitations No paroxysmal nocturnal dyspnea  No nausea No numbness or tingling of extremity  No polydipsia No polyuria  No speech difficulty No syncope    Last metabolic panel Lab Results  Component Value Date   GLUCOSE 141 (H) 10/13/2021   NA 136 10/13/2021   K 3.9 10/13/2021   BUN 13 10/13/2021   CREATININE 0.86 10/13/2021   EGFR 108 10/13/2021   GFRNONAA 117 03/11/2020   CALCIUM 9.4 10/13/2021   AST 29 10/13/2021   ALT 40 10/13/2021   The 10-year ASCVD risk score (Arnett DK, et al., 2019) is: 32.2%  --------------------------------------------------------------------------------------------------- Rare smoking, not daily - not ready to quit  Medications: Outpatient Medications Prior to Visit  Medication Sig   COD LIVER OIL PO Take by mouth as needed.   eszopiclone (LUNESTA) 2 MG TABS tablet Take 1 tablet (2 mg total) by mouth at bedtime as needed for sleep. Take immediately before bedtime   hydrochlorothiazide (HYDRODIURIL) 25 MG tablet Take 1 tablet (25 mg total) by mouth daily.   Multiple Vitamin (MULTIVITAMIN) capsule Take 1 capsule by mouth daily.   No facility-administered medications prior to visit.    Review of Systems per HPI  Objective    BP (!) 140/90 (BP Location: Left Arm, Patient Position: Sitting, Cuff Size: Large)   Pulse 72   Temp 98.8 F (37.1 C) (Oral)   Resp 16   Ht _0  (1.727 m)   Wt 187 lb 12.8 oz (85.2 kg)   BMI 28.55 kg/m    Physical Exam Vitals reviewed.  Constitutional:      General: He is not in  acute distress.    Appearance: Normal appearance. He is not diaphoretic.  HENT:     Head: Normocephalic and atraumatic.  Eyes:     General: No scleral icterus.    Conjunctiva/sclera: Conjunctivae normal.  Cardiovascular:     Rate and Rhythm: Normal rate and regular rhythm.     Pulses: Normal pulses.     Heart sounds: Normal heart sounds. No murmur heard. Pulmonary:     Effort: Pulmonary effort is normal. No respiratory distress.     Breath sounds: Normal breath sounds. No wheezing or rhonchi.  Musculoskeletal:     Cervical back: Neck supple.     Right lower leg: No edema.     Left lower leg: No edema.  Lymphadenopathy:     Cervical: No cervical adenopathy.  Skin:    General: Skin is warm and dry.     Findings: No rash.  Neurological:     Mental Status: He is alert and oriented to person, place, and time. Mental status is at baseline.  Psychiatric:        Mood and Affect: Mood normal.        Behavior: Behavior normal.       Results for orders placed or performed in visit on 04/17/22  POCT glycosylated hemoglobin (Hb A1C)  Result Value Ref Range   Hemoglobin A1C 5.9 (A) 4.0 - 5.6 %   Est. average glucose Bld gHb Est-mCnc 123     Assessment & Plan     Problem List Items Addressed This Visit       Cardiovascular and Mediastinum   Hypertension associated with diabetes ([place])    Elevated today but was improving through his visit He feels this is related to working a 24h shift last night No changes to medications Monitor at home if able Patient decliens ARB/ACEi      Relevant Orders   Comprehensive metabolic panel     Endocrine   Hyperlipidemia associated with type 2 diabetes mellitus ([place])    Reviewed last lipid panel - uncontrolled Not currently on a statin - patient declines Recheck FLP and CMP Discussed diet and exercise       Relevant Orders   Comprehensive metabolic panel   Lipid Panel With LDL/HDL Ratio   Diabetes mellitus ([place]) - Primary    Chronic  and well controlled Continue diet control Complicated by HTN, HLD, microalbuminuria Remind of need for eye exam      Relevant Orders   POCT glycosylated hemoglobin (Hb A1C) (Completed)   Microalbuminuria due to type 2 diabetes mellitus ([place])    Have discussed risks of progression to CKD Patient declines ACEi/ARB treatment Will continue to monitor        Other   Avitaminosis D   Relevant Orders   VITAMIN D 25 Hydroxy (Vit-D Deficiency, Fractures)     Return in about 3 months (around 07/18/2022) for CPE.      I, Lavon Paganini, MD, have reviewed all documentation for this visit. The documentation on 04/17/22 for the exam, diagnosis, procedures, and orders are all accurate  and complete.   Bac[place]alupo, Dionne Bucy, MD, MPH [facility]

## 2022-04-17 ENCOUNTER — Encounter: Payer: Self-pay | Admitting: Family Medicine

## 2022-04-17 ENCOUNTER — Ambulatory Visit (INDEPENDENT_AMBULATORY_CARE_PROVIDER_SITE_OTHER): Payer: Managed Care, Other (non HMO) | Admitting: Family Medicine

## 2022-04-17 VITALS — BP 140/90 | HR 72 | Temp 98.8°F | Resp 16 | Ht 68.0 in | Wt 187.8 lb

## 2022-04-17 DIAGNOSIS — E1129 Type 2 diabetes mellitus with other diabetic kidney complication: Secondary | ICD-10-CM

## 2022-04-17 DIAGNOSIS — R809 Proteinuria, unspecified: Secondary | ICD-10-CM | POA: Diagnosis not present

## 2022-04-17 DIAGNOSIS — E1159 Type 2 diabetes mellitus with other circulatory complications: Secondary | ICD-10-CM | POA: Diagnosis not present

## 2022-04-17 DIAGNOSIS — E785 Hyperlipidemia, unspecified: Secondary | ICD-10-CM

## 2022-04-17 DIAGNOSIS — I152 Hypertension secondary to endocrine disorders: Secondary | ICD-10-CM

## 2022-04-17 DIAGNOSIS — E559 Vitamin D deficiency, unspecified: Secondary | ICD-10-CM

## 2022-04-17 DIAGNOSIS — E1169 Type 2 diabetes mellitus with other specified complication: Secondary | ICD-10-CM | POA: Diagnosis not present

## 2022-04-17 LAB — POCT GLYCOSYLATED HEMOGLOBIN (HGB A1C)
Est. average glucose Bld gHb Est-mCnc: 123
Hemoglobin A1C: 5.9 % — AB (ref 4.0–5.6)

## 2022-04-17 NOTE — Assessment & Plan Note (Signed)
Chronic and well controlled Continue diet control Complicated by HTN, HLD, microalbuminuria Remind of need for eye exam

## 2022-04-17 NOTE — Assessment & Plan Note (Signed)
Reviewed last lipid panel - uncontrolled Not currently on a statin - patient declines Recheck FLP and CMP Discussed diet and exercise  

## 2022-04-17 NOTE — Assessment & Plan Note (Signed)
Elevated today but was improving through his visit He feels this is related to working a 24h shift last night No changes to medications Monitor at home if able Patient decliens ARB/ACEi

## 2022-04-17 NOTE — Assessment & Plan Note (Signed)
Have discussed risks of progression to CKD Patient declines ACEi/ARB treatment Will continue to monitor

## 2022-04-18 LAB — COMPREHENSIVE METABOLIC PANEL
ALT: 46 IU/L — ABNORMAL HIGH (ref 0–44)
AST: 43 IU/L — ABNORMAL HIGH (ref 0–40)
Albumin/Globulin Ratio: 1.6 (ref 1.2–2.2)
Albumin: 4.6 g/dL (ref 4.1–5.1)
Alkaline Phosphatase: 46 IU/L (ref 44–121)
BUN/Creatinine Ratio: 15 (ref 9–20)
BUN: 13 mg/dL (ref 6–24)
Bilirubin Total: 1.2 mg/dL (ref 0.0–1.2)
CO2: 25 mmol/L (ref 20–29)
Calcium: 9.7 mg/dL (ref 8.7–10.2)
Chloride: 97 mmol/L (ref 96–106)
Creatinine, Ser: 0.85 mg/dL (ref 0.76–1.27)
Globulin, Total: 2.8 g/dL (ref 1.5–4.5)
Glucose: 108 mg/dL — ABNORMAL HIGH (ref 70–99)
Potassium: 4.1 mmol/L (ref 3.5–5.2)
Sodium: 138 mmol/L (ref 134–144)
Total Protein: 7.4 g/dL (ref 6.0–8.5)
eGFR: 108 mL/min/{1.73_m2} (ref >59)

## 2022-04-18 LAB — LIPID PANEL WITH LDL/HDL RATIO
Cholesterol, Total: 325 mg/dL — ABNORMAL HIGH (ref 100–199)
HDL: 22 mg/dL — ABNORMAL LOW (ref >39)
LDL Chol Calc (NIH): 270 mg/dL — ABNORMAL HIGH (ref 0–99)
LDL/HDL Ratio: 12.3 ratio — ABNORMAL HIGH (ref 0.0–3.6)
Triglycerides: 162 mg/dL — ABNORMAL HIGH (ref 0–149)
VLDL Cholesterol Cal: 33 mg/dL (ref 5–40)

## 2022-04-18 LAB — VITAMIN D 25 HYDROXY (VIT D DEFICIENCY, FRACTURES): Vit D, 25-Hydroxy: 24.4 ng/mL — ABNORMAL LOW (ref 30.0–100.0)

## 2022-04-20 ENCOUNTER — Other Ambulatory Visit: Payer: Self-pay

## 2022-04-20 DIAGNOSIS — I152 Hypertension secondary to endocrine disorders: Secondary | ICD-10-CM

## 2022-07-26 NOTE — Progress Notes (Signed)
I,Joseline E Rosas,acting as a scribe for Lavon Paganini, MD.,have documented all relevant documentation on the behalf of Lavon Paganini, MD,as directed by  Lavon Paganini, MD while in the presence of Lavon Paganini, MD.    Complete physical exam   Patient: Keith Cohen   DOB: [birth year]   47 y.o. Male  MRN: [id number] Visit Date: 07/27/2022  Today's healthcare provider: Lavon Paganini, MD   Chief Complaint  Patient presents with   Annual Exam   Subjective    Keith Cohen is a 47 y.o. male who presents today for a complete physical exam.  He reports consuming a  low carb  and low sugar  diet. Home exercise routine includes cardio and weights. He generally feels well. He reports sleeping well. He does not have additional problems to discuss today.  HPI   Not a daily smoker. Cut back on alcohol intake.  History reviewed. No pertinent past medical history. Past Surgical History:  Procedure Laterality Date   COLONOSCOPY WITH PROPOFOL N/A 08/22/2021   Procedure: COLONOSCOPY WITH PROPOFOL;  Surgeon: Lucilla Lame, MD;  Location: [facility] ENDOSCOPY;  Service: Endoscopy;  Laterality: N/A;   WISDOM TOOTH EXTRACTION  2003   Social History   Socioeconomic History   Marital status: Divorced    Spouse name: Not on file   Number of children: 0   Years of education: Not on file   Highest education level: Not on file  Occupational History   Not on file  Tobacco Use   Smoking status: Some Days    Packs/day: 0.25    Years: 15.00    Total pack years: 3.75    Types: Cigarettes   Smokeless tobacco: Former    Types: Chew   Tobacco comments:    Smokes 6 cigs per week.  Vaping Use   Vaping Use: Some days  Substance and Sexual Activity   Alcohol use: Yes    Alcohol/week: 10.0 standard drinks of alcohol    Types: 10 Cans of beer per week   Drug use: No   Sexual activity: Yes    Partners: Female    Birth control/protection: Condom  Other Topics Concern    Not on file  Social History Narrative   Not on file   Social Determinants of Health   Financial Resource Strain: Not on file  Food Insecurity: Not on file  Transportation Needs: Not on file  Physical Activity: Not on file  Stress: Not on file  Social Connections: Not on file  Intimate Partner Violence: Not on file   Family Status  Relation Name Status   Mother  Alive   Father  Alive   PGF  Deceased   MGM  Deceased   MGF  Deceased   PGM  Deceased   Psychiatrist  (Not Specified)   Neg Hx  (Not Specified)   Family History  Problem Relation Age of Onset   Diabetes Mother    Healthy Father    Diabetes Paternal Grandfather    Prostate cancer Paternal Uncle    Colon cancer Neg Hx    Breast cancer Neg Hx    No Known Allergies  Patient Care Team: Courney Garrod, Dionne Bucy, MD as PCP - General (Family Medicine) Bary Castilla, Forest Gleason, MD (General Surgery)   Medications: Outpatient Medications Prior to Visit  Medication Sig   COD LIVER OIL PO Take by mouth as needed.   eszopiclone (LUNESTA) 2 MG TABS tablet Take 1 tablet (2 mg total) by mouth  at bedtime as needed for sleep. Take immediately before bedtime   hydrochlorothiazide (HYDRODIURIL) 25 MG tablet Take 1 tablet (25 mg total) by mouth daily.   Multiple Vitamin (MULTIVITAMIN) capsule Take 1 capsule by mouth daily.   No facility-administered medications prior to visit.    Review of Systems  All other systems reviewed and are negative.     Objective    BP 135/80   Pulse 87   Resp 16   Ht 5' 8"$  (1.727 m)   Wt 185 lb 8 oz (84.1 kg)   BMI 28.21 kg/m     Physical Exam Vitals reviewed.  Constitutional:      General: He is not in acute distress.    Appearance: Normal appearance. He is well-developed. He is not diaphoretic.  HENT:     Head: Normocephalic and atraumatic.     Right Ear: Tympanic membrane, ear canal and external ear normal.     Left Ear: Tympanic membrane, ear canal and external ear normal.     Nose: Nose  normal.     Mouth/Throat:     Mouth: Mucous membranes are moist.     Pharynx: Oropharynx is clear. No oropharyngeal exudate.  Eyes:     General: No scleral icterus.    Conjunctiva/sclera: Conjunctivae normal.     Pupils: Pupils are equal, round, and reactive to light.  Neck:     Thyroid: No thyromegaly.  Cardiovascular:     Rate and Rhythm: Normal rate and regular rhythm.     Pulses: Normal pulses.     Heart sounds: Normal heart sounds. No murmur heard. Pulmonary:     Effort: Pulmonary effort is normal. No respiratory distress.     Breath sounds: Normal breath sounds. No wheezing or rales.  Abdominal:     General: There is no distension.     Palpations: Abdomen is soft.     Tenderness: There is no abdominal tenderness.  Musculoskeletal:     Cervical back: Neck supple.     Right lower leg: No edema.     Left lower leg: No edema.  Lymphadenopathy:     Cervical: No cervical adenopathy.  Skin:    General: Skin is warm and dry.     Findings: No rash.  Neurological:     Mental Status: He is alert and oriented to person, place, and time. Mental status is at baseline.     Sensory: No sensory deficit.     Motor: No weakness.     Gait: Gait normal.  Psychiatric:        Mood and Affect: Mood normal.        Behavior: Behavior normal.        Thought Content: Thought content normal.       Last depression screening scores    07/27/2022    9:45 AM 04/17/2022    9:26 AM 10/13/2021    8:42 AM  PHQ 2/9 Scores  PHQ - 2 Score 0 0 0  PHQ- 9 Score 0 0 0   Last fall risk screening    07/27/2022    9:45 AM  Fall Risk   Falls in the past year? 0  Number falls in past yr: 0  Injury with Fall? 0  Risk for fall due to : No Fall Risks   Last Audit-C alcohol use screening    07/27/2022    9:45 AM  Alcohol Use Disorder Test (AUDIT)  1. How often do you have a drink containing alcohol? 2  2. How many drinks containing alcohol do you have on a typical day when you are drinking? 1  3.  How often do you have six or more drinks on one occasion? 2  AUDIT-C Score 5  4. How often during the last year have you found that you were not able to stop drinking once you had started? 0  5. How often during the last year have you failed to do what was normally expected from you because of drinking? 0  6. How often during the last year have you needed a first drink in the morning to get yourself going after a heavy drinking session? 0  7. How often during the last year have you had a feeling of guilt of remorse after drinking? 0  8. How often during the last year have you been unable to remember what happened the night before because you had been drinking? 0  9. Have you or someone else been injured as a result of your drinking? 0  10. Has a relative or friend or a doctor or another health worker been concerned about your drinking or suggested you cut down? 0  Alcohol Use Disorder Identification Test Final Score (AUDIT) 5   A score of 3 or more in women, and 4 or more in men indicates increased risk for alcohol abuse, EXCEPT if all of the points are from question 1   No results found for any visits on 07/27/22.  Assessment & Plan    Routine Health Maintenance and Physical Exam  Exercise Activities and Dietary recommendations  Goals      Quit smoking / using tobacco        Immunization History  Administered Date(s) Administered   PFIZER(Purple Top)SARS-COV-2 Vaccination 04/11/2020, 05/09/2020   Tdap 10/16/2010, 07/11/2021    Health Maintenance  Topic Date Due   OPHTHALMOLOGY EXAM  Never done   COVID-19 Vaccine (3 - 2023-24 season) 02/09/2022   INFLUENZA VACCINE  09/09/2022 (Originally 01/09/2022)   Diabetic kidney evaluation - Urine ACR  10/14/2022   HEMOGLOBIN A1C  10/16/2022   Diabetic kidney evaluation - eGFR measurement  04/18/2023   FOOT EXAM  07/28/2023   COLONOSCOPY (Pts 45-2yr Insurance coverage will need to be confirmed)  08/22/2028   DTaP/Tdap/Td (3 - Td or  Tdap) 07/12/2031   Hepatitis C Screening  Completed   HIV Screening  Completed   HPV VACCINES  Aged Out    Discussed health benefits of physical activity, and encouraged him to engage in regular exercise appropriate for his age and condition.  Problem List Items Addressed This Visit       Cardiovascular and Mediastinum   Hypertension associated with diabetes ([place]    Well controlled Continue current medications Recheck metabolic panel F/u in 6 months       Relevant Orders   Comprehensive metabolic panel     Endocrine   Hyperlipidemia associated with type 2 diabetes mellitus ([place]    Reviewed last lipid panel - uncontrolled Not currently on a statin - patient declines Recheck FLP and CMP Discussed diet and exercise       Relevant Orders   Comprehensive metabolic panel   Lipid panel   Diabetes mellitus ([place]    Chronic and well controlled Recheck A1c Continue diet control Complicated by HTN, HLD, microalbuminuria Remind of need for eye exam Foot exam completed today      Relevant Orders   Hemoglobin A1c   Urine Microalbumin w/creat. ratio   Ambulatory referral to  Ophthalmology   Microalbuminuria due to type 2 diabetes mellitus ([place])    Have discussed risks of progression to CKD Patient declines ACE/ARB treatment Repeat UACR      Relevant Orders   Urine Microalbumin w/creat. ratio     Other   Family history of prostate cancer    Continue to monitor annual PSA Patient is asymptomatic      Relevant Orders   PSA Total (Reflex To Free)   Avitaminosis D    Continue supplement Recheck Vit D level      Relevant Orders   Vitamin D (25 hydroxy)   Other Visit Diagnoses     Encounter for annual physical exam    -  Primary   Relevant Orders   Comprehensive metabolic panel   Lipid panel   Vitamin D (25 hydroxy)   Hemoglobin A1c   PSA Total (Reflex To Free)        Return in about 6 months (around 01/25/2023) for chronic disease f/u.     I, Lavon Paganini, MD, have reviewed all documentation for this visit. The documentation on 07/27/22 for the exam, diagnosis, procedures, and orders are all accurate and complete.   Chemere Steffler, Dionne Bucy, MD, MPH [facility]

## 2022-07-27 ENCOUNTER — Ambulatory Visit (INDEPENDENT_AMBULATORY_CARE_PROVIDER_SITE_OTHER): Payer: Managed Care, Other (non HMO) | Admitting: Family Medicine

## 2022-07-27 ENCOUNTER — Encounter: Payer: Self-pay | Admitting: Family Medicine

## 2022-07-27 VITALS — BP 135/80 | HR 87 | Resp 16 | Ht 68.0 in | Wt 185.5 lb

## 2022-07-27 DIAGNOSIS — Z0000 Encounter for general adult medical examination without abnormal findings: Secondary | ICD-10-CM | POA: Diagnosis not present

## 2022-07-27 DIAGNOSIS — I152 Hypertension secondary to endocrine disorders: Secondary | ICD-10-CM

## 2022-07-27 DIAGNOSIS — E1159 Type 2 diabetes mellitus with other circulatory complications: Secondary | ICD-10-CM

## 2022-07-27 DIAGNOSIS — E559 Vitamin D deficiency, unspecified: Secondary | ICD-10-CM

## 2022-07-27 DIAGNOSIS — E785 Hyperlipidemia, unspecified: Secondary | ICD-10-CM

## 2022-07-27 DIAGNOSIS — R809 Proteinuria, unspecified: Secondary | ICD-10-CM

## 2022-07-27 DIAGNOSIS — E1169 Type 2 diabetes mellitus with other specified complication: Secondary | ICD-10-CM | POA: Diagnosis not present

## 2022-07-27 DIAGNOSIS — E1129 Type 2 diabetes mellitus with other diabetic kidney complication: Secondary | ICD-10-CM | POA: Diagnosis not present

## 2022-07-27 DIAGNOSIS — Z8042 Family history of malignant neoplasm of prostate: Secondary | ICD-10-CM

## 2022-07-27 NOTE — Assessment & Plan Note (Signed)
Continue to monitor annual PSA Patient is asymptomatic

## 2022-07-27 NOTE — Assessment & Plan Note (Signed)
Well controlled Continue current medications Recheck metabolic panel F/u in 6 months  

## 2022-07-27 NOTE — Assessment & Plan Note (Signed)
Continue supplement Recheck Vit D level 

## 2022-07-27 NOTE — Assessment & Plan Note (Signed)
Reviewed last lipid panel - uncontrolled Not currently on a statin - patient declines Recheck FLP and CMP Discussed diet and exercise

## 2022-07-27 NOTE — Assessment & Plan Note (Signed)
Chronic and well controlled Recheck A1c Continue diet control Complicated by HTN, HLD, microalbuminuria Remind of need for eye exam Foot exam completed today

## 2022-07-27 NOTE — Assessment & Plan Note (Signed)
Have discussed risks of progression to CKD Patient declines ACE/ARB treatment Repeat UACR

## 2022-07-29 LAB — MICROALBUMIN / CREATININE URINE RATIO
Creatinine, Urine: 145.3 mg/dL
Microalb/Creat Ratio: 246 mg/g creat — ABNORMAL HIGH (ref 0–29)
Microalbumin, Urine: 358.1 ug/mL

## 2022-07-29 LAB — COMPREHENSIVE METABOLIC PANEL
ALT: 224 IU/L — ABNORMAL HIGH (ref 0–44)
AST: 132 IU/L — ABNORMAL HIGH (ref 0–40)
Albumin/Globulin Ratio: 1.7 (ref 1.2–2.2)
Albumin: 4.8 g/dL (ref 4.1–5.1)
Alkaline Phosphatase: 47 IU/L (ref 44–121)
BUN/Creatinine Ratio: 14 (ref 9–20)
BUN: 13 mg/dL (ref 6–24)
Bilirubin Total: 1.5 mg/dL — ABNORMAL HIGH (ref 0.0–1.2)
CO2: 23 mmol/L (ref 20–29)
Calcium: 10 mg/dL (ref 8.7–10.2)
Chloride: 90 mmol/L — ABNORMAL LOW (ref 96–106)
Creatinine, Ser: 0.94 mg/dL (ref 0.76–1.27)
Globulin, Total: 2.9 g/dL (ref 1.5–4.5)
Glucose: 135 mg/dL — ABNORMAL HIGH (ref 70–99)
Potassium: 4.2 mmol/L (ref 3.5–5.2)
Sodium: 133 mmol/L — ABNORMAL LOW (ref 134–144)
Total Protein: 7.7 g/dL (ref 6.0–8.5)
eGFR: 101 mL/min/{1.73_m2} (ref >59)

## 2022-07-29 LAB — LIPID PANEL
Chol/HDL Ratio: 11.3 ratio — ABNORMAL HIGH (ref 0.0–5.0)
Cholesterol, Total: 350 mg/dL — ABNORMAL HIGH (ref 100–199)
HDL: 31 mg/dL — ABNORMAL LOW (ref >39)
LDL Chol Calc (NIH): 297 mg/dL — ABNORMAL HIGH (ref 0–99)
Triglycerides: 118 mg/dL (ref 0–149)
VLDL Cholesterol Cal: 22 mg/dL (ref 5–40)

## 2022-07-29 LAB — HEMOGLOBIN A1C
Est. average glucose Bld gHb Est-mCnc: 126 mg/dL
Hgb A1c MFr Bld: 6 % — ABNORMAL HIGH (ref 4.8–5.6)

## 2022-07-29 LAB — PSA TOTAL (REFLEX TO FREE): Prostate Specific Ag, Serum: 1.4 ng/mL (ref 0.0–4.0)

## 2022-07-29 LAB — VITAMIN D 25 HYDROXY (VIT D DEFICIENCY, FRACTURES): Vit D, 25-Hydroxy: 22.5 ng/mL — ABNORMAL LOW (ref 30.0–100.0)

## 2022-08-24 LAB — HM DIABETES EYE EXAM

## 2022-08-28 ENCOUNTER — Encounter: Payer: Self-pay | Admitting: Family Medicine

## 2022-10-18 ENCOUNTER — Other Ambulatory Visit: Payer: Self-pay

## 2022-10-18 MED ORDER — ESZOPICLONE 2 MG PO TABS: 2.0000 mg | ORAL_TABLET | Freq: Every evening | ORAL | 0 refills | Status: DC | PRN

## 2023-01-28 ENCOUNTER — Ambulatory Visit (INDEPENDENT_AMBULATORY_CARE_PROVIDER_SITE_OTHER): Payer: Managed Care, Other (non HMO) | Admitting: Family Medicine

## 2023-01-28 ENCOUNTER — Encounter: Payer: Self-pay | Admitting: Family Medicine

## 2023-01-28 VITALS — BP 142/78 | HR 75 | Temp 98.1°F | Resp 16 | Ht 68.0 in | Wt 189.7 lb

## 2023-01-28 DIAGNOSIS — R809 Proteinuria, unspecified: Secondary | ICD-10-CM

## 2023-01-28 DIAGNOSIS — R7401 Elevation of levels of liver transaminase levels: Secondary | ICD-10-CM

## 2023-01-28 DIAGNOSIS — E1159 Type 2 diabetes mellitus with other circulatory complications: Secondary | ICD-10-CM

## 2023-01-28 DIAGNOSIS — E1169 Type 2 diabetes mellitus with other specified complication: Secondary | ICD-10-CM | POA: Diagnosis not present

## 2023-01-28 DIAGNOSIS — E1129 Type 2 diabetes mellitus with other diabetic kidney complication: Secondary | ICD-10-CM

## 2023-01-28 DIAGNOSIS — I152 Hypertension secondary to endocrine disorders: Secondary | ICD-10-CM

## 2023-01-28 DIAGNOSIS — E785 Hyperlipidemia, unspecified: Secondary | ICD-10-CM

## 2023-01-28 LAB — POCT GLYCOSYLATED HEMOGLOBIN (HGB A1C): Hemoglobin A1C: 6.5 % — AB (ref 4.0–5.6)

## 2023-01-28 NOTE — Assessment & Plan Note (Addendum)
Relatively well controlled  Hbg A1c was 6.5  Continue management with lifestyle modifications

## 2023-01-28 NOTE — Assessment & Plan Note (Addendum)
Uncontrolled  In office BP was elevated at 142 / 78  Continue current meds  Patient declined ARB therapy  Recheck CMP F/u 6 months

## 2023-01-28 NOTE — Progress Notes (Signed)
Established Patient Office Visit  Subjective   Patient ID: Keith Cohen, male    DOB: 1975-09-25  Age: 47 y.o. MRN: [id number]  Chief Complaint  Patient presents with   Medical Management of Chronic Issues    Patient reports good compliance and tolerance with medications.     Keith Cohen is here for medical management of his chronic conditions. He has no major questions or concerns today. He reports compliance with his medications. He is active and is prioritizing his health conditions with diet and exercise.  He does not want any additional medications. He would like to control everything with lifestyle modifications. He endorses understanding that lifestyle modifications would not be sufficient to control his lipid levels and microscopic kidney damage.    Patient Active Problem List   Diagnosis Date Noted   Transaminitis 01/28/2023   Microalbuminuria due to type 2 diabetes mellitus (HCC) 10/13/2021   Polyp of sigmoid colon    Diabetes mellitus (HCC) 07/11/2021   Overweight 04/11/2020   Hypertension associated with diabetes (HCC) 03/11/2020   Hyperlipidemia associated with type 2 diabetes mellitus (HCC) 07/04/2018   Avitaminosis D 07/04/2018   Lipoma of torso 03/31/2018   Family history of prostate cancer 03/31/2018   Sleep disorder, shift-work 03/31/2018   Axillary hidradenitis suppurativa 08/19/2014      Review of Systems  All other systems reviewed and are negative.     Objective:     BP (!) 142/78 (BP Location: Right Arm, Patient Position: Sitting, Cuff Size: Large)   Pulse 75   Temp 98.1 F (36.7 C) (Temporal)   Resp 16   Ht 5\' 8"  (1.727 m)   Wt 189 lb 11.2 oz (86 kg)   SpO2 97%   BMI 28.84 kg/m  BP Readings from Last 3 Encounters:  01/28/23 (!) 142/78  07/27/22 135/80  04/17/22 (!) 140/90   Wt Readings from Last 3 Encounters:  01/28/23 189 lb 11.2 oz (86 kg)  07/27/22 185 lb 8 oz (84.1 kg)  04/17/22 187 lb 12.8 oz (85.2 kg)      Physical  Exam Constitutional:      Appearance: Normal appearance.  Cardiovascular:     Rate and Rhythm: Normal rate and regular rhythm.     Heart sounds: Normal heart sounds.  Pulmonary:     Effort: Pulmonary effort is normal.     Breath sounds: Normal breath sounds.  Neurological:     General: No focal deficit present.     Mental Status: He is alert and oriented to person, place, and time.      Results for orders placed or performed in visit on 01/28/23  POCT glycosylated hemoglobin (Hb A1C)  Result Value Ref Range   Hemoglobin A1C 6.5 (A) 4.0 - 5.6 %    Last metabolic panel Lab Results  Component Value Date   GLUCOSE 135 (H) 07/27/2022   NA 133 (L) 07/27/2022   K 4.2 07/27/2022   CL 90 (L) 07/27/2022   CO2 23 07/27/2022   BUN 13 07/27/2022   CREATININE 0.94 07/27/2022   EGFR 101 07/27/2022   CALCIUM 10.0 07/27/2022   PHOS 3.6 03/27/2017   PROT 7.7 07/27/2022   ALBUMIN 4.8 07/27/2022   LABGLOB 2.9 07/27/2022   AGRATIO 1.7 07/27/2022   BILITOT 1.5 (H) 07/27/2022   ALKPHOS 47 07/27/2022   AST 132 (H) 07/27/2022   ALT 224 (H) 07/27/2022   Last lipids Lab Results  Component Value Date   CHOL 350 (H) 07/27/2022  HDL 31 (L) 07/27/2022   LDLCALC 297 (H) 07/27/2022   TRIG 118 07/27/2022   CHOLHDL 11.3 (H) 07/27/2022   Last hemoglobin A1c Lab Results  Component Value Date   HGBA1C 6.5 (A) 01/28/2023   Last vitamin D Lab Results  Component Value Date   VD25OH 22.5 (L) 07/27/2022      The ASCVD Risk score (Arnett DK, et al., 2019) failed to calculate for the following reasons:   The valid total cholesterol range is 130 to 320 mg/dL    Assessment & Plan:   Problem List Items Addressed This Visit       Cardiovascular and Mediastinum   Hypertension associated with diabetes (HCC)    Uncontrolled  In office BP was elevated at 142 / 78  Continue current meds  Patient declined ARB therapy  Recheck CMP F/u 6 months       Relevant Orders   Comprehensive  metabolic panel     Endocrine   Hyperlipidemia associated with type 2 diabetes mellitus (HCC)    Uncontrolled  Patient declined statin therapy Encouraged lifestyle modifications  Recheck lipid panel       Relevant Orders   Comprehensive metabolic panel   Lipid panel   Microalbuminuria due to type 2 diabetes mellitus (HCC)    Have discussed risks of progression to CKD  Patient declined ACE/ARB tmt         Other   Transaminitis    Uncontrolled  Elevated LFTs on last CMP  Recheck CMP Pending results consider RUQ [place]      Other Visit Diagnoses     Type 2 diabetes mellitus with microalbuminuria, without long-term current use of insulin (HCC)    -  Primary   Relevant Orders   POCT glycosylated hemoglobin (Hb A1C) (Completed)       Return in about 6 months (around 07/31/2023) for CPE.    Rometta Emery, Medical Student  Patient seen along with MS3 student Jodi Marble. I personally evaluated this patient along with the student, and verified all aspects of the history, physical exam, and medical decision making as documented by the student. I agree with the student's documentation and have made all necessary edits.  Maritssa Haughton, Marzella Schlein, MD, MPH [facility]

## 2023-01-28 NOTE — Assessment & Plan Note (Addendum)
Uncontrolled  Elevated LFTs on last CMP  Recheck CMP Pending results consider RUQ [place]

## 2023-01-28 NOTE — Assessment & Plan Note (Signed)
Uncontrolled  Patient declined statin therapy Encouraged lifestyle modifications  Recheck lipid panel

## 2023-01-28 NOTE — Assessment & Plan Note (Signed)
Have discussed risks of progression to CKD  Patient declined ACE/ARB tmt

## 2023-01-29 LAB — LIPID PANEL
Chol/HDL Ratio: 7.5 ratio — ABNORMAL HIGH (ref 0.0–5.0)
Cholesterol, Total: 241 mg/dL — ABNORMAL HIGH (ref 100–199)
HDL: 32 mg/dL — ABNORMAL LOW (ref >39)
LDL Chol Calc (NIH): 180 mg/dL — ABNORMAL HIGH (ref 0–99)
Triglycerides: 157 mg/dL — ABNORMAL HIGH (ref 0–149)
VLDL Cholesterol Cal: 29 mg/dL (ref 5–40)

## 2023-01-29 LAB — COMPREHENSIVE METABOLIC PANEL
ALT: 42 IU/L (ref 0–44)
AST: 33 IU/L (ref 0–40)
Albumin: 4.1 g/dL (ref 4.1–5.1)
Alkaline Phosphatase: 52 IU/L (ref 44–121)
BUN/Creatinine Ratio: 16 (ref 9–20)
BUN: 12 mg/dL (ref 6–24)
Bilirubin Total: 1.3 mg/dL — ABNORMAL HIGH (ref 0.0–1.2)
CO2: 26 mmol/L (ref 20–29)
Calcium: 9.3 mg/dL (ref 8.7–10.2)
Chloride: 96 mmol/L (ref 96–106)
Creatinine, Ser: 0.74 mg/dL — ABNORMAL LOW (ref 0.76–1.27)
Globulin, Total: 2.7 g/dL (ref 1.5–4.5)
Glucose: 167 mg/dL — ABNORMAL HIGH (ref 70–99)
Potassium: 3.9 mmol/L (ref 3.5–5.2)
Sodium: 135 mmol/L (ref 134–144)
Total Protein: 6.8 g/dL (ref 6.0–8.5)
eGFR: 112 mL/min/{1.73_m2} (ref >59)

## 2023-04-01 ENCOUNTER — Telehealth: Payer: Self-pay | Admitting: Family Medicine

## 2023-04-01 NOTE — Telephone Encounter (Signed)
Called and informed pt that we have fax form , pt stated that he would like a copy mailed to hm, sent this for to be scan in patient and mail patient a copy

## 2023-04-01 NOTE — Telephone Encounter (Signed)
Patient brought the wrong form in August.   The correct form is being sent back for completion from [employer].  This needs Dr. Leonard Schwartz signature and then to be faxed to # on form by Oct. 31st. I completed blanks from his previous form so Dr. B only has to sign it.

## 2023-04-01 NOTE — Telephone Encounter (Signed)
Dr B  has signed Forms for patient and faxed form for patient

## 2023-04-08 ENCOUNTER — Other Ambulatory Visit: Payer: Self-pay | Admitting: Family Medicine

## 2023-04-09 NOTE — Telephone Encounter (Signed)
Requested medication (s) are due for refill today:   Provider to review  Requested medication (s) are on the active medication list:   Yes  Future visit scheduled:   Yes 07/29/2023    Last ordered: 10/18/2022 #90, 0 refills  Non delegated refill    Requested Prescriptions  Pending Prescriptions Disp Refills   eszopiclone (LUNESTA) 2 MG TABS tablet [Pharmacy Med Name: ESZOPICLONE TABS 2MG ] 90 tablet 0    Sig: TAKE 1 TABLET AT BEDTIME AS NEEDED FOR SLEEP. TAKE IMMEDIATELY BEFORE BEDTIME     Not Delegated - Psychiatry:  Anxiolytics/Hypnotics Failed - 04/08/2023 12:10 PM      Failed - This refill cannot be delegated      Failed - Urine Drug Screen completed in last 360 days      Passed - Valid encounter within last 6 months    Recent Outpatient Visits           2 months ago Type 2 diabetes mellitus with microalbuminuria, without long-term current use of insulin (HCC)   [place] [facility] [place], Marzella Schlein, MD   8 months ago Encounter for annual physical exam   [place] [facility] [place], Marzella Schlein, MD   11 months ago Type 2 diabetes mellitus with microalbuminuria, without long-term current use of insulin [facility])   [place] [facility] [place], Marzella Schlein, MD   1 year ago Type 2 diabetes mellitus with microalbuminuria, without long-term current use of insulin [facility])   [place] [facility] [place], Marzella Schlein, MD   1 year ago Encounter for annual physical exam   [place] [place], Marzella Schlein, MD       Future Appointments             In 3 months [place], Marzella Schlein, MD [facility]

## 2023-05-01 ENCOUNTER — Observation Stay
Admission: EM | Admit: 2023-05-01 | Discharge: 2023-05-02 | Disposition: A | Discharge status: Home / Self Care | Payer: Managed Care, Other (non HMO) | Attending: Internal Medicine | Admitting: Internal Medicine

## 2023-05-01 ENCOUNTER — Emergency Department: Payer: Managed Care, Other (non HMO)

## 2023-05-01 ENCOUNTER — Inpatient Hospital Stay: Payer: Managed Care, Other (non HMO)

## 2023-05-01 DIAGNOSIS — E876 Hypokalemia: Secondary | ICD-10-CM | POA: Diagnosis not present

## 2023-05-01 DIAGNOSIS — I10 Essential (primary) hypertension: Secondary | ICD-10-CM | POA: Insufficient documentation

## 2023-05-01 DIAGNOSIS — E782 Mixed hyperlipidemia: Secondary | ICD-10-CM | POA: Diagnosis present

## 2023-05-01 DIAGNOSIS — Z6829 Body mass index (BMI) 29.0-29.9, adult: Secondary | ICD-10-CM | POA: Diagnosis not present

## 2023-05-01 DIAGNOSIS — I214 Non-ST elevation (NSTEMI) myocardial infarction: Principal | ICD-10-CM

## 2023-05-01 DIAGNOSIS — Z79899 Other long term (current) drug therapy: Secondary | ICD-10-CM | POA: Insufficient documentation

## 2023-05-01 DIAGNOSIS — E1159 Type 2 diabetes mellitus with other circulatory complications: Secondary | ICD-10-CM | POA: Diagnosis present

## 2023-05-01 DIAGNOSIS — R079 Chest pain, unspecified: Secondary | ICD-10-CM | POA: Diagnosis not present

## 2023-05-01 DIAGNOSIS — I152 Hypertension secondary to endocrine disorders: Secondary | ICD-10-CM | POA: Diagnosis present

## 2023-05-01 DIAGNOSIS — E663 Overweight: Secondary | ICD-10-CM | POA: Diagnosis not present

## 2023-05-01 DIAGNOSIS — R0789 Other chest pain: Principal | ICD-10-CM | POA: Insufficient documentation

## 2023-05-01 DIAGNOSIS — F17210 Nicotine dependence, cigarettes, uncomplicated: Secondary | ICD-10-CM | POA: Diagnosis not present

## 2023-05-01 DIAGNOSIS — E119 Type 2 diabetes mellitus without complications: Secondary | ICD-10-CM

## 2023-05-01 DIAGNOSIS — E1169 Type 2 diabetes mellitus with other specified complication: Secondary | ICD-10-CM | POA: Diagnosis present

## 2023-05-01 LAB — TROPONIN I (HIGH SENSITIVITY)
Troponin I (High Sensitivity): 38 ng/L — ABNORMAL HIGH (ref <18)
Troponin I (High Sensitivity): 45 ng/L — ABNORMAL HIGH (ref <18)
Troponin I (High Sensitivity): 42 ng/L — ABNORMAL HIGH (ref <18)
Troponin I (High Sensitivity): 44 ng/L — ABNORMAL HIGH (ref <18)

## 2023-05-01 LAB — BASIC METABOLIC PANEL
Anion gap: 12 (ref 5–15)
BUN: 14 mg/dL (ref 6–20)
CO2: 29 mmol/L (ref 22–32)
Calcium: 9.6 mg/dL (ref 8.9–10.3)
Chloride: 92 mmol/L — ABNORMAL LOW (ref 98–111)
Creatinine, Ser: 1.02 mg/dL (ref 0.61–1.24)
GFR, Estimated: >60 mL/min (ref >60)
Glucose, Bld: 152 mg/dL — ABNORMAL HIGH (ref 70–99)
Potassium: 3.2 mmol/L — ABNORMAL LOW (ref 3.5–5.1)
Sodium: 133 mmol/L — ABNORMAL LOW (ref 135–145)

## 2023-05-01 LAB — CBC
HCT: 51.7 % (ref 39.0–52.0)
Hemoglobin: 17.5 g/dL — ABNORMAL HIGH (ref 13.0–17.0)
MCH: 30.8 pg (ref 26.0–34.0)
MCHC: 33.8 g/dL (ref 30.0–36.0)
MCV: 90.9 fL (ref 80.0–100.0)
Platelets: 275 10*3/uL (ref 150–400)
RBC: 5.69 MIL/uL (ref 4.22–5.81)
RDW: 13 % (ref 11.5–15.5)
WBC: 10.1 10*3/uL (ref 4.0–10.5)
nRBC: 0 % (ref 0.0–0.2)

## 2023-05-01 LAB — HEPATIC FUNCTION PANEL
ALT: 75 U/L — ABNORMAL HIGH (ref 0–44)
AST: 70 U/L — ABNORMAL HIGH (ref 15–41)
Albumin: 4 g/dL (ref 3.5–5.0)
Alkaline Phosphatase: 31 U/L — ABNORMAL LOW (ref 38–126)
Bilirubin, Direct: 0.4 mg/dL — ABNORMAL HIGH (ref 0.0–0.2)
Indirect Bilirubin: 2.1 mg/dL — ABNORMAL HIGH (ref 0.3–0.9)
Total Bilirubin: 2.5 mg/dL — ABNORMAL HIGH (ref <1.2)
Total Protein: 7.7 g/dL (ref 6.5–8.1)

## 2023-05-01 LAB — BRAIN NATRIURETIC PEPTIDE: B Natriuretic Peptide: 14.2 pg/mL (ref 0.0–100.0)

## 2023-05-01 LAB — PROTIME-INR
INR: 1 (ref 0.8–1.2)
Prothrombin Time: 13.5 s (ref 11.4–15.2)

## 2023-05-01 LAB — LIPID PANEL
Cholesterol: 404 mg/dL — ABNORMAL HIGH (ref 0–200)
HDL: 26 mg/dL — ABNORMAL LOW (ref >40)
LDL Cholesterol: 335 mg/dL — ABNORMAL HIGH (ref 0–99)
Total CHOL/HDL Ratio: 15.5 {ratio}
Triglycerides: 216 mg/dL — ABNORMAL HIGH (ref <150)
VLDL: 43 mg/dL — ABNORMAL HIGH (ref 0–40)

## 2023-05-01 LAB — APTT: aPTT: 23 s — ABNORMAL LOW (ref 24–36)

## 2023-05-01 LAB — TSH: TSH: 1.017 u[IU]/mL (ref 0.350–4.500)

## 2023-05-01 LAB — D-DIMER, QUANTITATIVE: D-Dimer, Quant: 0.65 ug{FEU}/mL — ABNORMAL HIGH (ref 0.00–0.50)

## 2023-05-01 LAB — CBG MONITORING, ED: Glucose-Capillary: 140 mg/dL — ABNORMAL HIGH (ref 70–99)

## 2023-05-01 MED ORDER — POTASSIUM CHLORIDE 10 MEQ/100ML IV SOLN
10.0000 meq | INTRAVENOUS | Status: AC
  Administered 2023-05-01 (×2): 10 meq via INTRAVENOUS
  Filled 2023-05-01: qty 100

## 2023-05-01 MED ORDER — HEPARIN (PORCINE) 25000 UT/250ML-% IV SOLN
1400.0000 [IU]/h | INTRAVENOUS | Status: DC
  Administered 2023-05-01: 1100 [IU]/h via INTRAVENOUS
  Filled 2023-05-01: qty 250

## 2023-05-01 MED ORDER — SODIUM CHLORIDE 0.9% FLUSH: 3.0000 mL | Freq: Two times a day (BID) | INTRAVENOUS | Status: DC

## 2023-05-01 MED ORDER — MORPHINE SULFATE (PF) 2 MG/ML IV SOLN: 2.0000 mg | INTRAVENOUS | Status: DC | PRN

## 2023-05-01 MED ORDER — HYDRALAZINE HCL 20 MG/ML IJ SOLN: 10.0000 mg | Freq: Four times a day (QID) | INTRAMUSCULAR | Status: DC | PRN

## 2023-05-01 MED ORDER — INSULIN ASPART 100 UNIT/ML IJ SOLN
0.0000 [IU] | INTRAMUSCULAR | Status: DC | PRN
  Administered 2023-05-01: 2 [IU] via SUBCUTANEOUS
  Filled 2023-05-01: qty 1

## 2023-05-01 MED ORDER — ATORVASTATIN CALCIUM 20 MG PO TABS: 40.0000 mg | ORAL_TABLET | Freq: Every day | ORAL | Status: DC

## 2023-05-01 MED ORDER — ONDANSETRON HCL 4 MG/2ML IJ SOLN: 4.0000 mg | Freq: Four times a day (QID) | INTRAMUSCULAR | Status: DC | PRN

## 2023-05-01 MED ORDER — ACETAMINOPHEN 325 MG PO TABS: 650.0000 mg | ORAL_TABLET | ORAL | Status: DC | PRN

## 2023-05-01 MED ORDER — SODIUM CHLORIDE 0.9 % IV SOLN: 250.0000 mL | INTRAVENOUS | Status: DC | PRN

## 2023-05-01 MED ORDER — IOHEXOL 350 MG/ML SOLN
75.0000 mL | Freq: Once | INTRAVENOUS | Status: AC | PRN
  Administered 2023-05-01: 75 mL via INTRAVENOUS

## 2023-05-01 MED ORDER — ASPIRIN 81 MG PO TBEC
81.0000 mg | DELAYED_RELEASE_TABLET | Freq: Every day | ORAL | Status: DC
  Administered 2023-05-02: 81 mg via ORAL
  Filled 2023-05-01: qty 1

## 2023-05-01 MED ORDER — NITROGLYCERIN 0.4 MG SL SUBL: 0.4000 mg | SUBLINGUAL_TABLET | SUBLINGUAL | Status: DC | PRN

## 2023-05-01 MED ORDER — HEPARIN BOLUS VIA INFUSION
4000.0000 [IU] | Freq: Once | INTRAVENOUS | Status: AC
  Administered 2023-05-01: 4000 [IU] via INTRAVENOUS
  Filled 2023-05-01: qty 4000

## 2023-05-01 MED ORDER — SODIUM CHLORIDE 0.9% FLUSH: 3.0000 mL | INTRAVENOUS | Status: DC | PRN

## 2023-05-01 NOTE — ED Notes (Signed)
Patient transported to CT 

## 2023-05-01 NOTE — Consult Note (Signed)
PHARMACY - ANTICOAGULATION CONSULT NOTE  Pharmacy Consult for Heparin  Indication: chest pain/ACS  No Known Allergies  Patient Measurements: Height: 5\' 8"  (172.7 cm) Weight: 88.5 kg (195 lb) IBW/kg (Calculated) : 68.4 Heparin Dosing Weight: 86.4 kg   Vital Signs: Temp: 98.3 F (36.8 C) (11/20 1715) Temp Source: Oral (11/20 1715) BP: 152/95 (11/20 1715) Pulse Rate: 91 (11/20 1715)  Labs: Recent Labs    05/01/23 1539 05/01/23 1731  HGB 17.5*  --   HCT 51.7  --   PLT 275  --   CREATININE 1.02  --   TROPONINIHS 38* 45*   Estimated Creatinine Clearance: 95.7 mL/min (by C-G formula based on SCr of 1.02 mg/dL).  Medical History: History reviewed. No pertinent past medical history.  Medications:  No PTA anticoagulation   Assessment: Soul Companion is a 47 year old male that presented with left-sided chest pain radiating to the left arm. Initial EKG showed no obvious STEMI. Troponins elevated at 38 and 45. Pharmacy has been consulted for management of a heparin infusion for an NSTEMI. Baseline labs: Hgb 17.5, PLT 275, aPTT and PT/INR pending.   Goal of Therapy:  Heparin level 0.3-0.7 units/ml Monitor platelets by anticoagulation protocol: Yes   Plan:  Give 4000 unit bolus x 1  Start heparin infusion at 1100 units/hr  Check HL 6 hours after initiation of infusion  Monitor CBC daily while on heparin   Littie Deeds, PharmD Pharmacy Resident  05/01/2023 6:27 PM

## 2023-05-01 NOTE — Assessment & Plan Note (Signed)
Admit to progressive unit. Suspect NSTEMI or PE , less likely CHF or PAH or GERD/ PUD related.  Pt given aspirin 81 mg / heparin gtt/Lipitor 40 / prn morphine x 4 doses /IV protonix and PRN Ntg.

## 2023-05-01 NOTE — Assessment & Plan Note (Signed)
TFT 

## 2023-05-01 NOTE — Assessment & Plan Note (Addendum)
Vitals:   05/01/23 1534 05/01/23 1715 05/01/23 1730 05/01/23 1800  BP: (!) 198/98 (!) 152/95 (!) 166/116 (!) 169/107   05/01/23 1830  BP: (!) 158/118  In chart it states pt is not taking hydrochlorothiazide however I am not sure he certainly has electrolyte abnormalities expected with diuretic therapy, although it could be from one episdoe of vomiting. We will still however get lipase and LFT.

## 2023-05-01 NOTE — ED Provider Notes (Signed)
[facility] Provider Note    Event Date/Time   First MD Initiated Contact with Patient 05/01/23 1537     (approximate)   History   Chest Pain   HPI Keith Cohen is a 47 y.o. male with HTN presenting today for chest pain.  Patient reportedly had onset of chest pain indigestion symptoms yesterday afternoon.  He described a pressure-like sensation with occasional radiation and numbness down his left arm.  He did have some nausea and 1 episode of vomiting.  Symptoms persisted into today.  Has had 1 episode of sweating.  Denies shortness of breath.  Denies leg pain, leg swelling.  No prior cardiac history.  Intermittent smoking use as well as alcohol use weekly but not daily.  No family history of early cardiac disease before the age of 82.  Does report episode 4 years ago similar to this when he was dehydrated and had high blood pressure.  En route EMS gave patient 324 mg aspirin, nitroglycerin, and fluids.  Reviewed most recent chart notes.     Physical Exam   Triage Vital Signs: ED Triage Vitals  Encounter Vitals Group     BP 05/01/23 1534 (!) 198/98     Systolic BP Percentile --      Diastolic BP Percentile --      Pulse Rate 05/01/23 1534 98     Resp 05/01/23 1534 (!) 21     Temp --      Temp src --      SpO2 05/01/23 1534 96 %     Weight 05/01/23 1537 195 lb (88.5 kg)     Height 05/01/23 1537 5\' 8"  (1.727 m)     Head Circumference --      Peak Flow --      Pain Score --      Pain Loc --      Pain Education --      Exclude from Growth Chart --     Most recent vital signs: Vitals:   05/01/23 1541 05/01/23 1715  BP:  (!) 152/95  Pulse:  91  Resp:  (!) 27  Temp: 98 F (36.7 C) 98.3 F (36.8 C)  SpO2:  94%   Physical Exam: I have reviewed the vital signs and nursing notes. General: Awake, alert, no acute distress.  Nontoxic appearing. Head:  Atraumatic, normocephalic.   ENT:  EOM intact, PERRL. Oral mucosa is pink and moist  with no lesions. Neck: Neck is supple with full range of motion, No meningeal signs. Cardiovascular:  RRR, No murmurs. Peripheral pulses palpable and equal bilaterally. Respiratory:  Symmetrical chest wall expansion.  No rhonchi, rales, or wheezes.  Good air movement throughout.  No use of accessory muscles.   Musculoskeletal:  No cyanosis or edema. Moving extremities with full ROM Abdomen:  Soft, nontender, nondistended. Neuro:  GCS 15, moving all four extremities, interacting appropriately. Speech clear. Psych:  Calm, appropriate.   Skin:  Warm, dry, no rash.    ED Results / Procedures / Treatments   Labs (all labs ordered are listed, but only abnormal results are displayed) Labs Reviewed  BASIC METABOLIC PANEL - Abnormal; Notable for the following components:      Result Value   Sodium 133 (*)    Potassium 3.2 (*)    Chloride 92 (*)    Glucose, Bld 152 (*)    All other components within normal limits  CBC - Abnormal; Notable for the following components:  Hemoglobin 17.5 (*)    All other components within normal limits  TROPONIN I (HIGH SENSITIVITY) - Abnormal; Notable for the following components:   Troponin I (High Sensitivity) 38 (*)    All other components within normal limits  TROPONIN I (HIGH SENSITIVITY) - Abnormal; Notable for the following components:   Troponin I (High Sensitivity) 45 (*)    All other components within normal limits     EKG My EKG interpretation: Rate of 93, normal sinus rhythm.  Possible left bundle branch block versus early repull noted in V2, V3.  T wave inversions present in lead II, III, and aVF.  No prior EKG to compare to.  No obvious ST elevations or depressions   RADIOLOGY Independently interpreted chest x-ray with no acute pathology   PROCEDURES:  Critical Care performed: Yes, see critical care procedure note(s)  .Critical Care  Performed by: Janith Lima, MD Authorized by: Janith Lima, MD   Critical care provider  statement:    Critical care time (minutes):  30   Critical care was necessary to treat or prevent imminent or life-threatening deterioration of the following conditions:  Cardiac failure (NSTEMI)   Critical care was time spent personally by me on the following activities:  Development of treatment plan with patient or surrogate, discussions with consultants, evaluation of patient's response to treatment, examination of patient, ordering and review of laboratory studies, ordering and review of radiographic studies, ordering and performing treatments and interventions, pulse oximetry, re-evaluation of patient's condition and review of old charts   Care discussed with: admitting provider      MEDICATIONS ORDERED IN ED: Medications - No data to display   IMPRESSION / MDM / ASSESSMENT AND PLAN / ED COURSE  I reviewed the triage vital signs and the nursing notes.                              Differential diagnosis includes, but is not limited to, ACS, reflux, stable angina, pneumonia, pneumothorax  Patient's presentation is most consistent with acute presentation with potential threat to life or bodily function.  Patient is a 47 year old male presenting today for left-sided chest pain intermittently associated with nausea, diaphoresis, and radiation to the left arm concerning for ACS.  Initial EKG with no obvious STEMI and this was confirmed with discussion with cardiologist on-call.  Although, there are notable T wave inversions in lead II, III, and aVF.  Chest x-ray with no acute pathology.  CBC and BMP only notable for mild hypokalemia.  Troponins elevated at 38 and 45.  Given story along with elevated troponin and concerning EKG, patient started on heparin and will mid to hospitalist for further care of NSTEMI.  The patient is on the cardiac monitor to evaluate for evidence of arrhythmia and/or significant heart rate changes. Clinical Course as of 05/01/23 1824  Wed May 01, 2023  1554 Spoke  with Dr. Darrold Junker regarding EKG. Does not meet STEMI criteria at this time. No activation required. [DW]  1609 Troponin I (High Sensitivity)(!): 38 Mild elevation. Will repeat at 2 hours. Plan for admission afterwards [DW]  1658 DG Chest 2 View Independently interpreted with no obvious acute pathology [DW]  1814 Troponin I (High Sensitivity)(!): 45 [DW]    Clinical Course User Index [DW] Janith Lima, MD     FINAL CLINICAL IMPRESSION(S) / ED DIAGNOSES   Final diagnoses:  NSTEMI (non-ST elevated myocardial infarction) (HCC)  Chest pain,  unspecified type     Rx / DC Orders   ED Discharge Orders     None        Note:  This document was prepared using Dragon voice recognition software and may include unintentional dictation errors.   Janith Lima, MD 05/01/23 [phone]

## 2023-05-01 NOTE — H&P (Signed)
History and Physical    Patient: Keith Cohen:[id number] DOB: 1976-02-27 DOA: 05/01/2023 DOS: the patient was seen and examined on 05/01/2023 PCP: Erasmo Downer, MD  Patient coming from: Home  Chief Complaint:  Chief Complaint  Patient presents with   Chest Pain    HPI: Keith Cohen is a 47 y.o. male with medical history significant for hypertension, diabetes mellitus type 2, hyperlipidemia Presents today with complaints of chest pain since the past day patient has associated indigestion symptoms, nausea 1 episode of vomiting diaphoresis.  No shortness of breath palpitation fevers chills abdominal pain diarrhea speech or gait issues numbness tingling incontinence or headaches.  In emergency room vitals trend shows: Vitals:   05/01/23 1715 05/01/23 1730 05/01/23 1800 05/01/23 1830  BP: (!) 152/95 (!) 166/116 (!) 169/107 (!) 158/118  Pulse: 91 91 87 83  Temp: 98.3 F (36.8 C)     Resp: (!) 27 17 (!) 26 20  Height:      Weight:      SpO2: 94% 95% 91% 92%  TempSrc: Oral     BMI (Calculated):      Initial EKG is abnormal with sinus rhythm at 93, PR interval QRS 100 QTc 461, QS wave pattern in lead I along with lead II and resolving in lead III suspect pulmonary hypertension or right ventricular strain, normal upright axis. ST elevation 1 mm in leads V1/V2.   No previous to compare, EDMD has d/w cardiology on call about concerning EKG and has diagnosed it as equivocal.  Abnormal Labs as below: Mild hyponatremia of 133 / hypokalemia of 3.2 /Glucose of 152. LFT added and pending. Troponin of 38 / 45. Hb of 17.5  In the ED pt received: Medications  heparin bolus via infusion 4,000 Units (has no administration in time range)  heparin ADULT infusion 100 units/mL (25000 units/225mL) (has no administration in time range)  hydrALAZINE (APRESOLINE) injection 10 mg (has no administration in time range)  sodium chloride flush (NS) 0.9 % injection 3 mL (has no  administration in time range)  sodium chloride flush (NS) 0.9 % injection 3 mL (has no administration in time range)  0.9 %  sodium chloride infusion (has no administration in time range)  aspirin EC tablet 81 mg (has no administration in time range)  nitroGLYCERIN (NITROSTAT) SL tablet 0.4 mg (has no administration in time range)  atorvastatin (LIPITOR) tablet 40 mg (has no administration in time range)  acetaminophen (TYLENOL) tablet 650 mg (has no administration in time range)  ondansetron (ZOFRAN) injection 4 mg (has no administration in time range)  morphine (PF) 2 MG/ML injection 2 mg (has no administration in time range)  insulin aspart (novoLOG) injection 0-15 Units (has no administration in time range)   Review of Systems  Constitutional:  Positive for diaphoresis.  Cardiovascular:  Positive for chest pain.  Gastrointestinal:  Positive for nausea and vomiting.   History reviewed. No pertinent past medical history. Past Surgical History:  Procedure Laterality Date   COLONOSCOPY WITH PROPOFOL N/A 08/22/2021   Procedure: COLONOSCOPY WITH PROPOFOL;  Surgeon: Midge Minium, MD;  Location: [facility] ENDOSCOPY;  Service: Endoscopy;  Laterality: N/A;   WISDOM TOOTH EXTRACTION  2003    reports that he has been smoking cigarettes. He has a 3.8 pack-year smoking history. He has quit using smokeless tobacco.  His smokeless tobacco use included chew. He reports current alcohol use of about 10.0 standard drinks of alcohol per week. He reports that he does not use  drugs.  No Known Allergies  Family History  Problem Relation Age of Onset   Diabetes Mother    Healthy Father    Diabetes Paternal Grandfather    Prostate cancer Paternal Uncle    Colon cancer Neg Hx    Breast cancer Neg Hx     Prior to Admission medications   Medication Sig Start Date End Date Taking? Authorizing Provider  COD LIVER OIL PO Take by mouth as needed.    [provider]  eszopiclone (LUNESTA) 2 MG TABS  tablet TAKE 1 TABLET AT BEDTIME AS NEEDED FOR SLEEP. TAKE IMMEDIATELY BEFORE BEDTIME 04/09/23   Bacigalupo, Marzella Schlein, MD  hydrochlorothiazide (HYDRODIURIL) 25 MG tablet Take 1 tablet (25 mg total) by mouth daily. 01/05/22   Erasmo Downer, MD  Multiple Vitamin (MULTIVITAMIN) capsule Take 1 capsule by mouth daily.    [provider]     Vitals:   05/01/23 1715 05/01/23 1730 05/01/23 1800 05/01/23 1830  BP: (!) 152/95 (!) 166/116 (!) 169/107 (!) 158/118  Pulse: 91 91 87 83  Resp: (!) 27 17 (!) 26 20  Temp: 98.3 F (36.8 C)     TempSrc: Oral     SpO2: 94% 95% 91% 92%  Weight:      Height:       Physical Exam Vitals and nursing note reviewed.  Constitutional:      General: He is not in acute distress. HENT:     Head: Normocephalic and atraumatic.     Right Ear: Hearing normal.     Left Ear: Hearing normal.     Nose: Nose normal. No nasal deformity.     Mouth/Throat:     Lips: Pink.     Tongue: No lesions.     Pharynx: Oropharynx is clear.  Eyes:     General: Lids are normal.     Extraocular Movements: Extraocular movements intact.  Cardiovascular:     Rate and Rhythm: Normal rate and regular rhythm.     Heart sounds: Normal heart sounds.  Pulmonary:     Effort: Pulmonary effort is normal.     Breath sounds: Normal breath sounds.  Abdominal:     General: Bowel sounds are normal. There is no distension.     Palpations: Abdomen is soft. There is no mass.     Tenderness: There is no abdominal tenderness.  Musculoskeletal:     Right lower leg: No edema.     Left lower leg: No edema.  Skin:    General: Skin is warm.  Neurological:     General: No focal deficit present.     Mental Status: He is alert and oriented to person, place, and time.     Cranial Nerves: Cranial nerves 2-12 are intact.  Psychiatric:        Attention and Perception: Attention normal.        Mood and Affect: Mood normal.        Speech: Speech normal.        Behavior: Behavior normal.  Behavior is cooperative.      Labs on Admission: I have personally reviewed following labs and imaging studies Results for orders placed or performed during the hospital encounter of 05/01/23 (from the past 24 hour(s))  Basic metabolic panel     Status: Abnormal   Collection Time: 05/01/23  3:39 PM  Result Value Ref Range   Sodium 133 (L) 135 - 145 mmol/L   Potassium 3.2 (L) 3.5 - 5.1 mmol/L  Chloride 92 (L) 98 - 111 mmol/L   CO2 29 22 - 32 mmol/L   Glucose, Bld 152 (H) 70 - 99 mg/dL   BUN 14 6 - 20 mg/dL   Creatinine, Ser 1.61 0.61 - 1.24 mg/dL   Calcium 9.6 8.9 - 09.6 mg/dL   GFR, Estimated >04 >54 mL/min   Anion gap 12 5 - 15  CBC     Status: Abnormal   Collection Time: 05/01/23  3:39 PM  Result Value Ref Range   WBC 10.1 4.0 - 10.5 K/uL   RBC 5.69 4.22 - 5.81 MIL/uL   Hemoglobin 17.5 (H) 13.0 - 17.0 g/dL   HCT 09.8 11.9 - 14.7 %   MCV 90.9 80.0 - 100.0 fL   MCH 30.8 26.0 - 34.0 pg   MCHC 33.8 30.0 - 36.0 g/dL   RDW 82.9 56.2 - 13.0 %   Platelets 275 150 - 400 K/uL   nRBC 0.0 0.0 - 0.2 %  Troponin I (High Sensitivity)     Status: Abnormal   Collection Time: 05/01/23  3:39 PM  Result Value Ref Range   Troponin I (High Sensitivity) 38 (H) <18 ng/L  Troponin I (High Sensitivity)     Status: Abnormal   Collection Time: 05/01/23  5:31 PM  Result Value Ref Range   Troponin I (High Sensitivity) 45 (H) <18 ng/L    CBC: Recent Labs  Lab 05/01/23 1539  WBC 10.1  HGB 17.5*  HCT 51.7  MCV 90.9  PLT 275   Basic Metabolic Panel: Recent Labs  Lab 05/01/23 1539  NA 133*  K 3.2*  CL 92*  CO2 29  GLUCOSE 152*  BUN 14  CREATININE 1.02  CALCIUM 9.6   GFR: Estimated Creatinine Clearance: 95.7 mL/min (by C-G formula based on SCr of 1.02 mg/dL). Liver Function Tests: No results for input(s): "AST", "ALT", "ALKPHOS", "BILITOT", "PROT", "ALBUMIN" in the last 168 hours. No results for input(s): "LIPASE", "AMYLASE" in the last 168 hours. No results for input(s):  "AMMONIA" in the last 168 hours. Coagulation Profile: No results for input(s): "INR", "PROTIME" in the last 168 hours. Cardiac Enzymes: No results for input(s): "CKTOTAL", "CKMB", "CKMBINDEX", "TROPONINI" in the last 168 hours. BNP (last 3 results) No results for input(s): "PROBNP" in the last 8760 hours. HbA1C: No results for input(s): "HGBA1C" in the last 72 hours. CBG: No results for input(s): "GLUCAP" in the last 168 hours. Lipid Profile: No results for input(s): "CHOL", "HDL", "LDLCALC", "TRIG", "CHOLHDL", "LDLDIRECT" in the last 72 hours. Thyroid Function Tests: No results for input(s): "TSH", "T4TOTAL", "FREET4", "T3FREE", "THYROIDAB" in the last 72 hours. Anemia Panel: No results for input(s): "VITAMINB12", "FOLATE", "FERRITIN", "TIBC", "IRON", "RETICCTPCT" in the last 72 hours. Urinalysis    Component Value Date/Time   APPEARANCEUR Clear 06/24/2019 1458   GLUCOSEU Negative 06/24/2019 1458   BILIRUBINUR Negative 06/24/2019 1458   PROTEINUR 1+ (A) 06/24/2019 1458   NITRITE Negative 06/24/2019 1458   LEUKOCYTESUR Negative 06/24/2019 1458   Unresulted Labs (From admission, onward)     Start     Ordered   05/02/23 0500  CBC  Tomorrow morning,   R        05/01/23 1840   05/02/23 0500  Lipoprotein A (LPA)  Tomorrow morning,   R        05/01/23 1852   05/02/23 0130  Heparin level (unfractionated)  Once-Timed,   URGENT        05/01/23 1840   05/01/23 1852  Brain  natriuretic peptide  Add-on,   AD        05/01/23 1852   05/01/23 1851  HIV Antibody (routine testing w rflx)  (HIV Antibody (Routine testing w reflex) panel)  Once,   R        05/01/23 1852   05/01/23 1847  Ethanol  Add-on,   AD        05/01/23 1846   05/01/23 1846  Hepatic function panel  Add-on,   AD        05/01/23 1845   05/01/23 1846  TSH  Add-on,   AD        05/01/23 1845   05/01/23 1846  Lipid panel  Add-on,   AD        05/01/23 1845   05/01/23 1846  High sensitivity CRP  Add-on,   AD        05/01/23  1845   05/01/23 1839  D-dimer, quantitative  ONCE - STAT,   STAT        05/01/23 1838   05/01/23 1828  APTT  Add-on,   AD        05/01/23 1828   05/01/23 1828  Protime-INR  Add-on,   AD        05/01/23 1828            Medications  heparin bolus via infusion 4,000 Units (has no administration in time range)  heparin ADULT infusion 100 units/mL (25000 units/266mL) (has no administration in time range)  hydrALAZINE (APRESOLINE) injection 10 mg (has no administration in time range)  sodium chloride flush (NS) 0.9 % injection 3 mL (has no administration in time range)  sodium chloride flush (NS) 0.9 % injection 3 mL (has no administration in time range)  0.9 %  sodium chloride infusion (has no administration in time range)  aspirin EC tablet 81 mg (has no administration in time range)  nitroGLYCERIN (NITROSTAT) SL tablet 0.4 mg (has no administration in time range)  atorvastatin (LIPITOR) tablet 40 mg (has no administration in time range)  acetaminophen (TYLENOL) tablet 650 mg (has no administration in time range)  ondansetron (ZOFRAN) injection 4 mg (has no administration in time range)  morphine (PF) 2 MG/ML injection 2 mg (has no administration in time range)  insulin aspart (novoLOG) injection 0-15 Units (has no administration in time range)    Radiological Exams on Admission: No results found.   Data Reviewed: Relevant notes from primary care and specialist visits, past discharge summaries as available in EHR, including Care Everywhere. Prior diagnostic testing as pertinent to current admission diagnoses Updated medications and problem lists for reconciliation ED course, including vitals, labs, imaging, treatment and response to treatment Triage notes, nursing and pharmacy notes and ED provider's notes Notable results as noted in HPI  Assessment and Plan: * Chest pain in adult Admit to progressive unit. Suspect NSTEMI or PE , less likely CHF or PAH or GERD/ PUD related.   Pt given aspirin 81 mg / heparin gtt/Lipitor 40 / prn morphine x 4 doses /IV protonix and PRN Ntg.     Overweight (BMI 25.0-29.9) TFT.   Diabetes mellitus (HCC) Controlled DM with previous A1c of 6.5 . Glycemic protocol. Carb consistent cardiac diet until MN then npo.  Hypertension associated with diabetes (HCC) Vitals:   05/01/23 1534 05/01/23 1715 05/01/23 1730 05/01/23 1800  BP: (!) 198/98 (!) 152/95 (!) 166/116 (!) 169/107   05/01/23 1830  BP: (!) 158/118  In chart it states pt is not  taking hydrochlorothiazide however I am not sure he certainly has electrolyte abnormalities expected with diuretic therapy, although it could be from one episdoe of vomiting. We will still however get lipase and LFT.      DVT prophylaxis:  Heparin gtt.  Consults:  None.  Advance Care Planning:    Code Status: Full Code   Family Communication:  None.  Disposition Plan:  Home.  Severity of Illness: The appropriate patient status for this patient is INPATIENT. Inpatient status is judged to be reasonable and necessary in order to provide the required intensity of service to ensure the patient's safety. The patient's presenting symptoms, physical exam findings, and initial radiographic and laboratory data in the context of their chronic comorbidities is felt to place them at high risk for further clinical deterioration. Furthermore, it is not anticipated that the patient will be medically stable for discharge from the hospital within 2 midnights of admission.   * I certify that at the point of admission it is my clinical judgment that the patient will require inpatient hospital care spanning beyond 2 midnights from the point of admission due to high intensity of service, high risk for further deterioration and high frequency of surveillance required.*  Author: Gertha Calkin, MD 05/01/2023 7:02 PM  For on call review [email or web address].

## 2023-05-01 NOTE — ED Triage Notes (Signed)
Per EMS pt complaining of CP/indigestion starting last night, today pressure increased, EKG showing abnormal rhythm. Pt denies any abd pain, SOB.

## 2023-05-01 NOTE — Assessment & Plan Note (Addendum)
Controlled DM with previous A1c of 6.5 . Glycemic protocol. Carb consistent cardiac diet until MN then npo.

## 2023-05-02 ENCOUNTER — Inpatient Hospital Stay (HOSPITAL_BASED_OUTPATIENT_CLINIC_OR_DEPARTMENT_OTHER)
Admit: 2023-05-02 | Discharge: 2023-05-02 | Disposition: A | Discharge status: Home / Self Care | Payer: Managed Care, Other (non HMO) | Attending: Cardiology | Admitting: Cardiology

## 2023-05-02 DIAGNOSIS — R079 Chest pain, unspecified: Secondary | ICD-10-CM

## 2023-05-02 DIAGNOSIS — I152 Hypertension secondary to endocrine disorders: Secondary | ICD-10-CM | POA: Diagnosis not present

## 2023-05-02 DIAGNOSIS — R809 Proteinuria, unspecified: Secondary | ICD-10-CM

## 2023-05-02 DIAGNOSIS — E782 Mixed hyperlipidemia: Secondary | ICD-10-CM

## 2023-05-02 DIAGNOSIS — I200 Unstable angina: Secondary | ICD-10-CM

## 2023-05-02 DIAGNOSIS — E1129 Type 2 diabetes mellitus with other diabetic kidney complication: Secondary | ICD-10-CM | POA: Diagnosis not present

## 2023-05-02 LAB — CBC
HCT: 48.1 % (ref 39.0–52.0)
Hemoglobin: 16.4 g/dL (ref 13.0–17.0)
MCH: 30.9 pg (ref 26.0–34.0)
MCHC: 34.1 g/dL (ref 30.0–36.0)
MCV: 90.6 fL (ref 80.0–100.0)
Platelets: 261 10*3/uL (ref 150–400)
RBC: 5.31 MIL/uL (ref 4.22–5.81)
RDW: 13.1 % (ref 11.5–15.5)
WBC: 9.5 10*3/uL (ref 4.0–10.5)
nRBC: 0 % (ref 0.0–0.2)

## 2023-05-02 LAB — HIV ANTIBODY (ROUTINE TESTING W REFLEX): HIV Screen 4th Generation wRfx: NONREACTIVE

## 2023-05-02 LAB — ECHOCARDIOGRAM COMPLETE
AR max vel: 2.89 cm2
AV Area VTI: 3 cm2
AV Area mean vel: 2.76 cm2
AV Mean grad: 3 mm[Hg]
AV Peak grad: 6.5 mm[Hg]
Ao pk vel: 1.27 m/s
Area-P 1/2: 3.74 cm2
Calc EF: 42.9 %
Height: 68 in
MV VTI: 3.18 cm2
S' Lateral: 3.6 cm
Single Plane A2C EF: 41.3 %
Single Plane A4C EF: 45.3 %
Weight: 3120 [oz_av]

## 2023-05-02 LAB — CBG MONITORING, ED: Glucose-Capillary: 119 mg/dL — ABNORMAL HIGH (ref 70–99)

## 2023-05-02 LAB — HEPARIN LEVEL (UNFRACTIONATED): Heparin Unfractionated: <0.1 [IU]/mL — ABNORMAL LOW (ref 0.30–0.70)

## 2023-05-02 LAB — ETHANOL: Alcohol, Ethyl (B): <10 mg/dL (ref <10)

## 2023-05-02 MED ORDER — IPRATROPIUM-ALBUTEROL 0.5-2.5 (3) MG/3ML IN SOLN: 3.0000 mL | RESPIRATORY_TRACT | Status: DC | PRN

## 2023-05-02 MED ORDER — SENNOSIDES-DOCUSATE SODIUM 8.6-50 MG PO TABS: 1.0000 | ORAL_TABLET | Freq: Every evening | ORAL | Status: DC | PRN

## 2023-05-02 MED ORDER — GUAIFENESIN 100 MG/5ML PO LIQD: 5.0000 mL | ORAL | Status: DC | PRN

## 2023-05-02 MED ORDER — LOSARTAN POTASSIUM 50 MG PO TABS: 50.0000 mg | ORAL_TABLET | Freq: Every day | ORAL | 0 refills | Status: DC

## 2023-05-02 MED ORDER — METOPROLOL TARTRATE 5 MG/5ML IV SOLN: 5.0000 mg | INTRAVENOUS | Status: DC | PRN

## 2023-05-02 MED ORDER — ATORVASTATIN CALCIUM 40 MG PO TABS: 40.0000 mg | ORAL_TABLET | Freq: Every day | ORAL | 0 refills | Status: DC

## 2023-05-02 MED ORDER — HEPARIN BOLUS VIA INFUSION
2600.0000 [IU] | Freq: Once | INTRAVENOUS | Status: AC
  Administered 2023-05-02: 2600 [IU] via INTRAVENOUS
  Filled 2023-05-02: qty 2600

## 2023-05-02 MED ORDER — ASPIRIN 81 MG PO TBEC: 81.0000 mg | DELAYED_RELEASE_TABLET | Freq: Every day | ORAL | 0 refills | Status: DC

## 2023-05-02 MED ORDER — LOSARTAN POTASSIUM 50 MG PO TABS
50.0000 mg | ORAL_TABLET | Freq: Every day | ORAL | Status: DC
  Administered 2023-05-02: 50 mg via ORAL
  Filled 2023-05-02: qty 1

## 2023-05-02 MED ORDER — TRAZODONE HCL 50 MG PO TABS: 50.0000 mg | ORAL_TABLET | Freq: Every evening | ORAL | Status: DC | PRN

## 2023-05-02 NOTE — Consult Note (Signed)
PHARMACY - ANTICOAGULATION CONSULT NOTE  Pharmacy Consult for Heparin  Indication: chest pain/ACS  No Known Allergies  Patient Measurements: Height: 5\' 8"  (172.7 cm) Weight: 88.5 kg (195 lb) IBW/kg (Calculated) : 68.4 Heparin Dosing Weight: 86.4 kg   Vital Signs: Temp: 99.2 F (37.3 C) (11/20 1917) Temp Source: Oral (11/20 1917) BP: 159/113 (11/21 0400) Pulse Rate: 84 (11/21 0400)  Labs: Recent Labs    05/01/23 1539 05/01/23 1731 05/01/23 1855 05/01/23 1911 05/01/23 2053 05/02/23 0242  HGB 17.5*  --   --   --   --  16.4  HCT 51.7  --   --   --   --  48.1  PLT 275  --   --   --   --  261  APTT  --   --  23*  --   --   --   LABPROT  --   --  13.5  --   --   --   INR  --   --  1.0  --   --   --   HEPARINUNFRC  --   --   --   --   --  <0.10*  CREATININE 1.02  --   --   --   --   --   TROPONINIHS 38* 45*  --  42* 44*  --    Estimated Creatinine Clearance: 95.7 mL/min (by C-G formula based on SCr of 1.02 mg/dL).  Medical History: History reviewed. No pertinent past medical history.  Medications:  No PTA anticoagulation   Assessment: Keith Cohen is a 47 year old male that presented with left-sided chest pain radiating to the left arm. Initial EKG showed no obvious STEMI. Troponins elevated at 38 and 45. Pharmacy has been consulted for management of a heparin infusion for an NSTEMI. Baseline labs: Hgb 17.5, PLT 275, aPTT and PT/INR pending.   Goal of Therapy:  Heparin level 0.3-0.7 units/ml Monitor platelets by anticoagulation protocol: Yes   Plan:  11/21:  HL @ 0242 = < 0.1 - will order heparin 2600 units IV X 1 bolus and increase drip rate to 1400 units/hr - will recheck HL 6 hrs after rate change  Irina Okelly D, PharmD 05/02/2023 4:44 AM

## 2023-05-02 NOTE — Progress Notes (Signed)
*  PRELIMINARY RESULTS* Echocardiogram 2D Echocardiogram has been performed.  Carolyne Fiscal 05/02/2023, 11:31 AM

## 2023-05-02 NOTE — Discharge Summary (Signed)
Physician Discharge Summary  Keith Cohen AVW:[id number] DOB: 1976-04-09 DOA: 05/01/2023  PCP: Erasmo Downer, MD  Admit date: 05/01/2023 Discharge date: 05/02/2023  Admitted From: Home Disposition: Home  Recommendations for Outpatient Follow-up:  Follow up with PCP in 1-2 weeks Please obtain BMP/CBC in one week your next doctors visit.  Aspirin and Lipitor started HCTZ changed to losartan   Discharge Condition: Stable CODE STATUS: Full code Diet recommendation: Heart healthy  Brief/Interim Summary: Brief Narrative:  47 year old with history of HTN, DM2, HLD comes to the hospital with chest pain.  EKG showed STEMI changes with RV strain/pulmonary hypertension.  Cardiology team was consulted.  Echocardiogram ordered cardiology team.  Echocardiogram showed some LVH changes otherwise unremarkable.  Today doing well therefore we will discharge patient in stable condition.  Due to hyperlipidemia he was ordered Lipitor. Medically stable for discharge.   Assessment & Plan:  Principal Problem:   Chest pain in adult Active Problems:   Hypertension associated with diabetes (HCC)   Diabetes mellitus (HCC)   Overweight (BMI 25.0-29.9)   Atypical chest pain - At this time appears to have subsided.  Cardiology team is recommending echocardiogram, if stable can be discharged.  Possible outpatient cardiac CTA .  Troponins are flat. -CTA chest is negative for PE -LDL 335.  Started on statin.  On aspirin.  Diabetes mellitus type 2 Not on any home medication  Essential hypertension Change HCTZ to losartan  DVT prophylaxis: SCD Code Status: Full Family Communication:   Status is: Inpatient Remains inpatient appropriate because: Discharge today i   Subjective: Doing well no complaints   Examination:  General exam: Appears calm and comfortable  Respiratory system: Clear to auscultation. Respiratory effort normal. Cardiovascular system: S1 & S2 heard, RRR. No JVD,  murmurs, rubs, gallops or clicks. No pedal edema. Gastrointestinal system: Abdomen is nondistended, soft and nontender. No organomegaly or masses felt. Normal bowel sounds heard. Central nervous system: Alert and oriented. No focal neurological deficits. Extremities: Symmetric 5 x 5 power. Skin: No rashes, lesions or ulcers Psychiatry: Judgement and insight appear normal. Mood & affect appropriate.    Discharge Diagnoses:  Principal Problem:   Chest pain Active Problems:   Mixed hyperlipidemia   Hypertension associated with diabetes (HCC)   Diabetes mellitus (HCC)   Overweight (BMI 25.0-29.9)     Discharge Exam: Vitals:   05/02/23 1138 05/02/23 1220  BP:  (!) 156/95  Pulse:  90  Resp:  (!) 22  Temp: 98.5 F (36.9 C)   SpO2:  97%   Vitals:   05/02/23 0800 05/02/23 1100 05/02/23 1138 05/02/23 1220  BP: (!) 142/98 (!) 138/91  (!) 156/95  Pulse: 80 80  90  Resp: 18 19  (!) 22  Temp:   98.5 F (36.9 C)   TempSrc:   Oral   SpO2: 96% 98%  97%  Weight:      Height:        Discharge Instructions   Allergies as of 05/02/2023   No Known Allergies      Medication List     STOP taking these medications    hydrochlorothiazide 25 MG tablet Commonly known as: HYDRODIURIL       TAKE these medications    aspirin EC 81 MG tablet Take 1 tablet (81 mg total) by mouth daily. Swallow whole. Start taking on: May 03, 2023   atorvastatin 40 MG tablet Commonly known as: LIPITOR Take 1 tablet (40 mg total) by mouth daily at 6 PM.  COD LIVER OIL PO Take by mouth as needed.   eszopiclone 2 MG Tabs tablet Commonly known as: LUNESTA TAKE 1 TABLET AT BEDTIME AS NEEDED FOR SLEEP. TAKE IMMEDIATELY BEFORE BEDTIME   losartan 50 MG tablet Commonly known as: COZAAR Take 1 tablet (50 mg total) by mouth daily.   multivitamin capsule Take 1 capsule by mouth daily.        No Known Allergies  You were cared for by a hospitalist during your hospital stay. If you  have any questions about your discharge medications or the care you received while you were in the hospital after you are discharged, you can call the unit and asked to speak with the hospitalist on call if the hospitalist that took care of you is not available. Once you are discharged, your primary care physician will handle any further medical issues. Please note that no refills for any discharge medications will be authorized once you are discharged, as it is imperative that you return to your primary care physician (or establish a relationship with a primary care physician if you do not have one) for your aftercare needs so that they can reassess your need for medications and monitor your lab values.  You were cared for by a hospitalist during your hospital stay. If you have any questions about your discharge medications or the care you received while you were in the hospital after you are discharged, you can call the unit and asked to speak with the hospitalist on call if the hospitalist that took care of you is not available. Once you are discharged, your primary care physician will handle any further medical issues. Please note that NO REFILLS for any discharge medications will be authorized once you are discharged, as it is imperative that you return to your primary care physician (or establish a relationship with a primary care physician if you do not have one) for your aftercare needs so that they can reassess your need for medications and monitor your lab values.  Please request your Prim.MD to go over all Hospital Tests and Procedure/Radiological results at the follow up, please get all Hospital records sent to your Prim MD by signing hospital release before you go home.  Get CBC, CMP, 2 view Chest X ray checked  by Primary MD during your next visit or SNF MD in 5-7 days ( we routinely change or add medications that can affect your baseline labs and fluid status, therefore we recommend that you get  the mentioned basic workup next visit with your PCP, your PCP may decide not to get them or add new tests based on their clinical decision)  On your next visit with your primary care physician please Get Medicines reviewed and adjusted.  If you experience worsening of your admission symptoms, develop shortness of breath, life threatening emergency, suicidal or homicidal thoughts you must seek medical attention immediately by calling 911 or calling your MD immediately  if symptoms less severe.  You Must read complete instructions/literature along with all the possible adverse reactions/side effects for all the Medicines you take and that have been prescribed to you. Take any new Medicines after you have completely understood and accpet all the possible adverse reactions/side effects.   Do not drive, operate heavy machinery, perform activities at heights, swimming or participation in water activities or provide baby sitting services if your were admitted for syncope or siezures until you have seen by Primary MD or a Neurologist and advised to do so again.  Do not drive when taking Pain medications.   Procedures/Studies: ECHOCARDIOGRAM COMPLETE  Result Date: 05/02/2023    ECHOCARDIOGRAM REPORT   Patient Name:   Keith Cohen Date of Exam: 05/02/2023 Medical Rec #:  [id number]            Height:       68.0 in Accession #:    [id number]           Weight:       195.0 lb Date of Birth:  01-21-1976            BSA:          2.022 m Patient Age:    47 years             BP:           142/98 mmHg Patient Gender: M                    HR:           83 bpm. Exam Location:  [facility] Procedure: 2D Echo, 3D Echo, Cardiac Doppler, Color Doppler and Strain Analysis Indications:     NSTEMI  History:         Patient has no prior history of Echocardiogram examinations.                  Acute MI, Signs/Symptoms:Chest Pain; Risk Factors:Hypertension,                  Diabetes and Dyslipidemia.  Sonographer:     Mikki Harbor Referring Phys:  [id number] SHERI HAMMOCK Diagnosing Phys: Julien Nordmann MD  Sonographer Comments: Global longitudinal strain was attempted. IMPRESSIONS  1. Left ventricular ejection fraction, by estimation, is 50 %. Left ventricular ejection fraction by PLAX is 49 %. The left ventricle has low normal function. The left ventricle has no regional wall motion abnormalities. There is mild left ventricular hypertrophy. Left ventricular diastolic parameters are consistent with Grade I diastolic dysfunction (impaired relaxation). The average left ventricular global longitudinal strain is -13.5 %.  2. Right ventricular systolic function is normal. The right ventricular size is normal. Tricuspid regurgitation signal is inadequate for assessing PA pressure.  3. The mitral valve is normal in structure. Mild mitral valve regurgitation. No evidence of mitral stenosis.  4. The aortic valve is tricuspid. Aortic valve regurgitation is not visualized. No aortic stenosis is present.  5. There is mild dilatation of the aortic root, measuring 40 mm.  6. The inferior vena cava is normal in size with greater than 50% respiratory variability, suggesting right atrial pressure of 3 mmHg. FINDINGS  Left Ventricle: Left ventricular ejection fraction, by estimation, is 50 %. Left ventricular ejection fraction by PLAX is 49 %. The left ventricle has low normal function. The left ventricle has no regional wall motion abnormalities. The average left ventricular global longitudinal strain is -13.5 %. The left ventricular internal cavity size was normal in size. There is mild left ventricular hypertrophy. Left ventricular diastolic parameters are consistent with Grade I diastolic dysfunction (impaired  relaxation). Right Ventricle: The right ventricular size is normal. No increase in right ventricular wall thickness. Right ventricular systolic function is normal. Tricuspid regurgitation signal is inadequate for assessing PA pressure. Left  Atrium: Left atrial size was normal in size. Right Atrium: Right atrial size was normal in size. Pericardium: There is no evidence of pericardial effusion. Mitral Valve: The mitral valve is normal in structure. Mild mitral valve regurgitation. No  evidence of mitral valve stenosis. MV peak gradient, 3.0 mmHg. The mean mitral valve gradient is 1.0 mmHg. Tricuspid Valve: The tricuspid valve is normal in structure. Tricuspid valve regurgitation is not demonstrated. No evidence of tricuspid stenosis. Aortic Valve: The aortic valve is tricuspid. Aortic valve regurgitation is not visualized. No aortic stenosis is present. Aortic valve mean gradient measures 3.0 mmHg. Aortic valve peak gradient measures 6.5 mmHg. Aortic valve area, by VTI measures 3.00 cm. Pulmonic Valve: The pulmonic valve was normal in structure. Pulmonic valve regurgitation is not visualized. No evidence of pulmonic stenosis. Aorta: The aortic root is normal in size and structure. There is mild dilatation of the aortic root, measuring 40 mm. Venous: The inferior vena cava is normal in size with greater than 50% respiratory variability, suggesting right atrial pressure of 3 mmHg. IAS/Shunts: No atrial level shunt detected by color flow Doppler.  LEFT VENTRICLE PLAX 2D LV EF:         Left            Diastology                ventricular     LV e' medial:    6.53 cm/s                ejection        LV E/e' medial:  11.1                fraction by     LV e' lateral:   9.68 cm/s                PLAX is 49      LV E/e' lateral: 7.5                %. LVIDd:         4.80 cm         2D LVIDs:         3.60 cm         Longitudinal LV PW:         1.10 cm         Strain LV IVS:        1.10 cm         2D Strain GLS  -13.5 % LVOT diam:     2.00 cm         Avg: LV SV:         69 LV SV Index:   34 LVOT Area:     3.14 cm  LV Volumes (MOD) LV vol d, MOD    132.0 ml A2C: LV vol d, MOD    118.0 ml A4C: LV vol s, MOD    77.5 ml A2C: LV vol s, MOD    64.6 ml A4C: LV SV MOD  A2C:   54.5 ml LV SV MOD A4C:   118.0 ml LV SV MOD BP:    53.2 ml RIGHT VENTRICLE RV Basal diam:  3.60 cm RV Mid diam:    3.50 cm RV S prime:     12.00 cm/s TAPSE (M-mode): 2.2 cm LEFT ATRIUM             Index        RIGHT ATRIUM           Index LA diam:        3.60 cm 1.78 cm/m   RA Area:     17.40 cm LA Vol (A2C):   55.3 ml 27.35 ml/m  RA Volume:   53.30 ml  26.36 ml/m LA Vol (A4C):   55.8 ml 27.60 ml/m LA Biplane Vol: 59.0 ml 29.18 ml/m  AORTIC VALVE                    PULMONIC VALVE AV Area (Vmax):    2.89 cm     PV Vmax:       0.88 m/s AV Area (Vmean):   2.76 cm     PV Peak grad:  3.1 mmHg AV Area (VTI):     3.00 cm AV Vmax:           127.00 cm/s AV Vmean:          86.200 cm/s AV VTI:            0.229 m AV Peak Grad:      6.5 mmHg AV Mean Grad:      3.0 mmHg LVOT Vmax:         117.00 cm/s LVOT Vmean:        75.600 cm/s LVOT VTI:          0.219 m LVOT/AV VTI ratio: 0.96  AORTA Ao Root diam: 4.00 cm MITRAL VALVE MV Area (PHT): 3.74 cm    SHUNTS MV Area VTI:   3.18 cm    Systemic VTI:  0.22 m MV Peak grad:  3.0 mmHg    Systemic Diam: 2.00 cm MV Mean grad:  1.0 mmHg MV Vmax:       0.86 m/s MV Vmean:      48.0 cm/s MV Decel Time: 203 msec MV E velocity: 72.80 cm/s MV A velocity: 84.90 cm/s MV E/A ratio:  0.86 Julien Nordmann MD Electronically signed by Julien Nordmann MD Signature Date/Time: 05/02/2023/11:37:03 AM    Final    CT Angio Chest Pulmonary Embolism (PE) W or WO Contrast  Result Date: 05/01/2023 CLINICAL DATA:  Chest pain for 2 days, initial encounter EXAM: CT ANGIOGRAPHY CHEST WITH CONTRAST TECHNIQUE: Multidetector CT imaging of the chest was performed using the standard protocol during bolus administration of intravenous contrast. Multiplanar CT image reconstructions and MIPs were obtained to evaluate the vascular anatomy. RADIATION DOSE REDUCTION: This exam was performed according to the departmental dose-optimization program which includes automated exposure control, adjustment of the mA  and/or kV according to patient size and/or use of iterative reconstruction technique. CONTRAST:  75mL OMNIPAQUE IOHEXOL 350 MG/ML SOLN COMPARISON:  Plain film from earlier in the same day. FINDINGS: Cardiovascular: Thoracic aorta shows no aneurysmal dilatation or dissection. No cardiac enlargement is seen. The pulmonary artery is well visualized bilaterally. No filling defect to suggest pulmonary embolism is seen. No right heart strain is noted. Mediastinum/Nodes: Thoracic inlet is within normal limits. No hilar or mediastinal adenopathy is noted. Lungs/Pleura: The lungs are well aerated bilaterally. No focal infiltrate or effusion is noted. Upper Abdomen: Upper abdomen is within normal limits. Musculoskeletal: No chest wall abnormality. No acute or significant osseous findings. Review of the MIP images confirms the above findings. IMPRESSION: No evidence of pulmonary emboli. No acute abnormality noted. Electronically Signed   By: Alcide Clever M.D.   On: 05/01/2023 22:09   DG Chest 2 View  Result Date: 05/01/2023 CLINICAL DATA:  Chest pain. EXAM: CHEST - 2 VIEW COMPARISON:  December 27, 2005. FINDINGS: The heart size and mediastinal contours are within normal limits. Both lungs are clear. The visualized skeletal structures are unremarkable. IMPRESSION: No active cardiopulmonary disease. Electronically Signed   By: Fayrene Fearing  Christen Butter M.D.   On: 05/01/2023 19:09     The results of significant diagnostics from this hospitalization (including imaging, microbiology, ancillary and laboratory) are listed below for reference.     Microbiology: No results found for this or any previous visit (from the past 240 hour(s)).   Labs: BNP (last 3 results) Recent Labs    05/01/23 1539  BNP 14.2   Basic Metabolic Panel: Recent Labs  Lab 05/01/23 1539  NA 133*  K 3.2*  CL 92*  CO2 29  GLUCOSE 152*  BUN 14  CREATININE 1.02  CALCIUM 9.6   Liver Function Tests: Recent Labs  Lab 05/01/23 1911  AST 70*  ALT  75*  ALKPHOS 31*  BILITOT 2.5*  PROT 7.7  ALBUMIN 4.0   No results for input(s): "LIPASE", "AMYLASE" in the last 168 hours. No results for input(s): "AMMONIA" in the last 168 hours. CBC: Recent Labs  Lab 05/01/23 1539 05/02/23 0242  WBC 10.1 9.5  HGB 17.5* 16.4  HCT 51.7 48.1  MCV 90.9 90.6  PLT 275 261   Cardiac Enzymes: No results for input(s): "CKTOTAL", "CKMB", "CKMBINDEX", "TROPONINI" in the last 168 hours. BNP: Invalid input(s): "POCBNP" CBG: Recent Labs  Lab 05/01/23 2051 05/02/23 0448  GLUCAP 140* 119*   D-Dimer Recent Labs    05/01/23 1855  DDIMER 0.65*   Hgb A1c No results for input(s): "HGBA1C" in the last 72 hours. Lipid Profile Recent Labs    05/01/23 1731  CHOL 404*  HDL 26*  LDLCALC 335*  TRIG 216*  CHOLHDL 15.5   Thyroid function studies Recent Labs    05/01/23 1911  TSH 1.017   Anemia work up No results for input(s): "VITAMINB12", "FOLATE", "FERRITIN", "TIBC", "IRON", "RETICCTPCT" in the last 72 hours. Urinalysis    Component Value Date/Time   APPEARANCEUR Clear 06/24/2019 1458   GLUCOSEU Negative 06/24/2019 1458   BILIRUBINUR Negative 06/24/2019 1458   PROTEINUR 1+ (A) 06/24/2019 1458   NITRITE Negative 06/24/2019 1458   LEUKOCYTESUR Negative 06/24/2019 1458   Sepsis Labs Recent Labs  Lab 05/01/23 1539 05/02/23 0242  WBC 10.1 9.5   Microbiology No results found for this or any previous visit (from the past 240 hour(s)).   Time coordinating discharge:  I have spent 35 minutes face to face with the patient and on the ward discussing the patients care, assessment, plan and disposition with other care givers. >50% of the time was devoted counseling the patient about the risks and benefits of treatment/Discharge disposition and coordinating care.   SIGNED:   Miguel Rota, MD  [facility] 05/02/2023, 1:46 PM   If 7PM-7AM, please contact night-coverage

## 2023-05-02 NOTE — Consult Note (Signed)
Cardiology Consultation   Patient ID: Keith Cohen MRN: [id number]; DOB: September 30, 1975  Admit date: 05/01/2023 Date of Consult: 05/02/2023  PCP:  Erasmo Downer, MD    [facility]Care Providers Cardiologist:  None      New consult completed by Dr Mariah Milling  Patient Profile:   Keith Cohen is a 47 y.o. male with a hx of HTN, type II DM, HLD, who is being seen 05/02/2023 for the evaluation of chest pain at the request of Dr Allena Katz.  History of Present Illness:   Keith Cohen presented to the [facility] emergency department with complaints of chest pain since the previous day. He related his discomfort to indigestion type discomfort. He started that he had an energy drink but had not eaten anything out of the ordinary. He started having increased pressure and an abnormal rhythm. 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 There was some radiation into the left arm with numbness. He stated he had one episode of nausea with vomiting. Denied any other associated symptoms. No prior cardiac history and denies any family history. He does have some intermittent smoking as well as weekly alcohol use. Prior episode of becoming dehydrated with an elevated blood pressure about 4 years ago. EMS had given him 324 mg aspirin, nitroglycerin, and IVF.  Initial vital signs: blood pressure 198/98, pulse 98, respirations 21, temp 98.3  Pertinent labs: Sodium 133, potassium 3.2, chloride of 92, hemoglobin 17.5, high-sensitivity troponin 38 and 45  Imaging: Chest x-ray revealed no acute abnormality; CTA of the chest was negative for PE or any other acute abnormality  Medications received in the emergency department: Heparin bolus, heparin infusion, insulin and potassium supplement  Cardiology was consulted this morning for further evaluation of chest pain  History reviewed. No pertinent past medical history.  Past Surgical History:  Procedure Laterality Date    COLONOSCOPY WITH PROPOFOL N/A 08/22/2021   Procedure: COLONOSCOPY WITH PROPOFOL;  Surgeon: Midge Minium, MD;  Location: [facility] ENDOSCOPY;  Service: Endoscopy;  Laterality: N/A;   WISDOM TOOTH EXTRACTION  2003     Home Medications:  Prior to Admission medications   Medication Sig Start Date End Date Taking? Authorizing Provider  eszopiclone (LUNESTA) 2 MG TABS tablet TAKE 1 TABLET AT BEDTIME AS NEEDED FOR SLEEP. TAKE IMMEDIATELY BEFORE BEDTIME 04/09/23  Yes Bacigalupo, Marzella Schlein, MD  COD LIVER OIL PO Take by mouth as needed. Patient not taking: Reported on 05/01/2023    [provider]  hydrochlorothiazide (HYDRODIURIL) 25 MG tablet Take 1 tablet (25 mg total) by mouth daily. Patient not taking: Reported on 05/01/2023 01/05/22   Erasmo Downer, MD  Multiple Vitamin (MULTIVITAMIN) capsule Take 1 capsule by mouth daily. Patient not taking: Reported on 05/01/2023    [provider]    Inpatient Medications: Scheduled Meds:  aspirin EC  81 mg Oral Daily   atorvastatin  40 mg Oral q1800   Continuous Infusions:   PRN Meds: acetaminophen, guaiFENesin, hydrALAZINE, insulin aspart, ipratropium-albuterol, metoprolol tartrate, morphine injection, nitroGLYCERIN, ondansetron (ZOFRAN) IV, senna-docusate, traZODone  Allergies:   No Known Allergies  Social History:   Social History   Socioeconomic History   Marital status: Divorced    Spouse name: Not on file   Number of children: 0   Years of education: Not on file   Highest education level: Not on file  Occupational History   Not on file  Tobacco Use   Smoking status: Some Days  Current packs/day: 0.25    Average packs/day: 0.3 packs/day for 15.0 years (3.8 ttl pk-yrs)    Types: Cigarettes   Smokeless tobacco: Former    Types: Chew   Tobacco comments:    Smokes 6 cigs per week.  Vaping Use   Vaping status: Some Days  Substance and Sexual Activity   Alcohol use: Yes    Alcohol/week: 10.0 standard drinks of  alcohol    Types: 10 Cans of beer per week   Drug use: No   Sexual activity: Yes    Partners: Female    Birth control/protection: Condom  Other Topics Concern   Not on file  Social History Narrative   Not on file   Social Determinants of Health   Financial Resource Strain: Not on file  Food Insecurity: Not on file  Transportation Needs: Not on file  Physical Activity: Not on file  Stress: Not on file  Social Connections: Not on file  Intimate Partner Violence: Not on file    Family History:    Family History  Problem Relation Age of Onset   Diabetes Mother    Healthy Father    Diabetes Paternal Grandfather    Prostate cancer Paternal Uncle    Colon cancer Neg Hx    Breast cancer Neg Hx      ROS:  Please see the history of present illness.  Review of Systems  Cardiovascular:  Positive for chest pain.  Gastrointestinal:  Positive for nausea and vomiting.    All other ROS reviewed and negative.     Physical Exam/Data:   Vitals:   05/02/23 0400 05/02/23 0459 05/02/23 0600 05/02/23 0800  BP: (!) 159/113  118/87 (!) 142/98  Pulse: 84  87 80  Resp: 20  17 18   Temp:  98.7 F (37.1 C)    TempSrc:  Oral    SpO2: 96%  96% 96%  Weight:      Height:        Intake/Output Summary (Last 24 hours) at 05/02/2023 1056 Last data filed at 05/01/2023 2311 Gross per 24 hour  Intake 100 ml  Output --  Net 100 ml      05/01/2023    3:37 PM 01/28/2023    9:31 AM 07/27/2022    9:41 AM  Last 3 Weights  Weight (lbs) 195 lb 189 lb 11.2 oz 185 lb 8 oz  Weight (kg) 88.451 kg 86.047 kg 84.142 kg     Body mass index is 29.65 kg/m.  General:  Well nourished, well developed, in no acute distress HEENT: normal Neck: no JVD Vascular: No carotid bruits; Distal pulses 2+ bilaterally Cardiac:  normal S1, S2; RRR; no murmur  Lungs:  clear to auscultation bilaterally, no wheezing, rhonchi or rales  Abd: soft, nontender, no hepatomegaly  Ext: no edema Musculoskeletal:  No  deformities, BUE and BLE strength normal and equal Skin: warm and dry  Neuro:  CNs 2-12 intact, no focal abnormalities noted Psych:  Normal affect   EKG:  The EKG was personally reviewed and demonstrates: Sinus rhythm with a rate of 93 with early repolarization, LVH, TWI noted in II,III and aVF Telemetry:  Telemetry was personally reviewed and demonstrates: Sinus rhythm to sinus tach with rates of 80-100  Relevant CV Studies: Echocardiogram ordered and pending  Laboratory Data:  High Sensitivity Troponin:   Recent Labs  Lab 05/01/23 1539 05/01/23 1731 05/01/23 1911 05/01/23 2053  TROPONINIHS 38* 45* 42* 44*     Chemistry Recent Labs  Lab 05/01/23 1539  NA 133*  K 3.2*  CL 92*  CO2 29  GLUCOSE 152*  BUN 14  CREATININE 1.02  CALCIUM 9.6  GFRNONAA >60  ANIONGAP 12    Recent Labs  Lab 05/01/23 1911  PROT 7.7  ALBUMIN 4.0  AST 70*  ALT 75*  ALKPHOS 31*  BILITOT 2.5*   Lipids  Recent Labs  Lab 05/01/23 1731  CHOL 404*  TRIG 216*  HDL 26*  LDLCALC 335*  CHOLHDL 15.5    Hematology Recent Labs  Lab 05/01/23 1539 05/02/23 0242  WBC 10.1 9.5  RBC 5.69 5.31  HGB 17.5* 16.4  HCT 51.7 48.1  MCV 90.9 90.6  MCH 30.8 30.9  MCHC 33.8 34.1  RDW 13.0 13.1  PLT 275 261   Thyroid  Recent Labs  Lab 05/01/23 1911  TSH 1.017    BNP Recent Labs  Lab 05/01/23 1539  BNP 14.2    DDimer  Recent Labs  Lab 05/01/23 1855  DDIMER 0.65*     Radiology/Studies:  CT Angio Chest Pulmonary Embolism (PE) W or WO Contrast  Result Date: 05/01/2023 CLINICAL DATA:  Chest pain for 2 days, initial encounter EXAM: CT ANGIOGRAPHY CHEST WITH CONTRAST TECHNIQUE: Multidetector CT imaging of the chest was performed using the standard protocol during bolus administration of intravenous contrast. Multiplanar CT image reconstructions and MIPs were obtained to evaluate the vascular anatomy. RADIATION DOSE REDUCTION: This exam was performed according to the departmental  dose-optimization program which includes automated exposure control, adjustment of the mA and/or kV according to patient size and/or use of iterative reconstruction technique. CONTRAST:  75mL OMNIPAQUE IOHEXOL 350 MG/ML SOLN COMPARISON:  Plain film from earlier in the same day. FINDINGS: Cardiovascular: Thoracic aorta shows no aneurysmal dilatation or dissection. No cardiac enlargement is seen. The pulmonary artery is well visualized bilaterally. No filling defect to suggest pulmonary embolism is seen. No right heart strain is noted. Mediastinum/Nodes: Thoracic inlet is within normal limits. No hilar or mediastinal adenopathy is noted. Lungs/Pleura: The lungs are well aerated bilaterally. No focal infiltrate or effusion is noted. Upper Abdomen: Upper abdomen is within normal limits. Musculoskeletal: No chest wall abnormality. No acute or significant osseous findings. Review of the MIP images confirms the above findings. IMPRESSION: No evidence of pulmonary emboli. No acute abnormality noted. Electronically Signed   By: Alcide Clever M.D.   On: 05/01/2023 22:09   DG Chest 2 View  Result Date: 05/01/2023 CLINICAL DATA:  Chest pain. EXAM: CHEST - 2 VIEW COMPARISON:  December 27, 2005. FINDINGS: The heart size and mediastinal contours are within normal limits. Both lungs are clear. The visualized skeletal structures are unremarkable. IMPRESSION: No active cardiopulmonary disease. Electronically Signed   By: Lupita Raider M.D.   On: 05/01/2023 19:09     Assessment and Plan:   Chest pain -presented with indigestion like chest discomfort - high sensitivity troponins trended  38 and 45 -CTA chest negative for PE -chest pain free on exam -echocardiogram ordered and pending with further recommendations to follow -heparin drip discontinued -continued on telemetry monitoring -EKG as needed or pain or changes -continue with ASA 81 mg daily and atorvastatin 40 mg daily -Consider coronary CTA as outpatient if  echocardiogram is stable  Hypertension -blood pressure 142/98 -not currently taking any antihypertensives -as needed hydralazine  -vital signs per unit protocol  Hypokalemia -serum sodium 3.2 -potassium supplements given -monitor/trend/replete electrolytes as needed -daily bmp  Hyperlipidemia -LDL 335 -Lp(a) pending -started on atorvastatin 40  mg daily -repeat lipid and LFT's in 8-10 weeks  Type 2 diabetes -A1c 6.5 (01/2023) -continued management per IM  Risk Assessment/Risk Scores:     TIMI Risk Score for Unstable Angina or Non-ST Elevation MI:   The patient's TIMI risk score is 3, which indicates a 13% risk of all cause mortality, new or recurrent myocardial infarction or need for urgent revascularization in the next 14 days.          For questions or updates, please contact [facility] Please consult [email or web address] for contact info under    Signed, Karynn Deblasi, NP  05/02/2023 10:56 AM

## 2023-05-02 NOTE — Hospital Course (Addendum)
Brief Narrative:  47 year old with history of HTN, DM2, HLD comes to the hospital with chest pain.  EKG showed STEMI changes with RV strain/pulmonary hypertension.  Cardiology team was consulted.  Echocardiogram ordered cardiology team.  Echocardiogram showed some LVH changes otherwise unremarkable.  Today doing well therefore we will discharge patient in stable condition.  Due to hyperlipidemia he was ordered Lipitor. Medically stable for discharge.   Assessment & Plan:  Principal Problem:   Chest pain in adult Active Problems:   Hypertension associated with diabetes (HCC)   Diabetes mellitus (HCC)   Overweight (BMI 25.0-29.9)   Atypical chest pain - At this time appears to have subsided.  Cardiology team is recommending echocardiogram, if stable can be discharged.  Possible outpatient cardiac CTA .  Troponins are flat. -CTA chest is negative for PE -LDL 335.  Started on statin.  On aspirin.  Diabetes mellitus type 2 Not on any home medication  Essential hypertension Change HCTZ to losartan  DVT prophylaxis: SCD Code Status: Full Family Communication:   Status is: Inpatient Remains inpatient appropriate because: Discharge today i   Subjective: Doing well no complaints   Examination:  General exam: Appears calm and comfortable  Respiratory system: Clear to auscultation. Respiratory effort normal. Cardiovascular system: S1 & S2 heard, RRR. No JVD, murmurs, rubs, gallops or clicks. No pedal edema. Gastrointestinal system: Abdomen is nondistended, soft and nontender. No organomegaly or masses felt. Normal bowel sounds heard. Central nervous system: Alert and oriented. No focal neurological deficits. Extremities: Symmetric 5 x 5 power. Skin: No rashes, lesions or ulcers Psychiatry: Judgement and insight appear normal. Mood & affect appropriate.

## 2023-05-03 ENCOUNTER — Telehealth: Payer: Self-pay | Admitting: Cardiology

## 2023-05-03 LAB — HIGH SENSITIVITY CRP: CRP, High Sensitivity: 0.92 mg/L (ref 0.00–3.00)

## 2023-05-03 NOTE — Telephone Encounter (Signed)
-----   Message from [facility] sent at 05/03/2023 12:29 PM EST ----- Please schedule a follow up appointment with patient after he was seen in the ED for chest pain in the next 4-5 weeks with me.  Thanks, [employer]

## 2023-05-03 NOTE — Telephone Encounter (Signed)
I called pt on cell but voice mall was full so I called pt on Home and left a message to have a follow up appointment for 5 to 6 weeks with [facility] or [place].

## 2023-05-04 LAB — LIPOPROTEIN A (LPA): Lipoprotein (a): <8.4 nmol/L (ref <75.0)

## 2023-06-18 ENCOUNTER — Telehealth: Payer: Self-pay

## 2023-06-18 NOTE — Telephone Encounter (Signed)
 I honestly don't remember discussing a cyst 6 months ago, so I can't guide him about whether we can remove it/open it up or not. I would need to eval and then either do it or point him in the right direction. If he has a dermatologist, they could almost certainly handle it if he wishes to go that route. If he doesn't have a dermatologist, recommend seeing someone in this office for eval and further management.

## 2023-06-18 NOTE — Telephone Encounter (Signed)
 Copied from CRM [id number]. Topic: General - Other >> Jun 17, 2023  4:51 PM Victoria B wrote: Reason for CRM: pt called in about cyst on his back, that wasn't bothering when he discussed about 6 mnths ago with Dr B him until now, and says itchy and wants to confirm if it can opened up and cleaned out in office or does he need to go somewhere else. Please cb

## 2023-06-19 NOTE — Telephone Encounter (Signed)
 Attempted to reach patient- no answer- mailbox is full- unable to leave call back message

## 2023-06-19 NOTE — Telephone Encounter (Signed)
 Patient called and advised of message from Dr. Beryle Flock, he asked me to schedule OV with Dr. Leonard Schwartz.

## 2023-06-28 ENCOUNTER — Encounter: Payer: Self-pay | Admitting: Family Medicine

## 2023-06-28 ENCOUNTER — Ambulatory Visit: Payer: Managed Care, Other (non HMO) | Admitting: Family Medicine

## 2023-06-28 VITALS — BP 160/104 | HR 84 | Ht 68.0 in | Wt 189.8 lb

## 2023-06-28 DIAGNOSIS — I252 Old myocardial infarction: Secondary | ICD-10-CM | POA: Diagnosis not present

## 2023-06-28 DIAGNOSIS — D171 Benign lipomatous neoplasm of skin and subcutaneous tissue of trunk: Secondary | ICD-10-CM

## 2023-06-28 DIAGNOSIS — I152 Hypertension secondary to endocrine disorders: Secondary | ICD-10-CM | POA: Diagnosis not present

## 2023-06-28 DIAGNOSIS — E1159 Type 2 diabetes mellitus with other circulatory complications: Secondary | ICD-10-CM | POA: Diagnosis not present

## 2023-06-28 NOTE — Progress Notes (Signed)
Acute visit   Patient: Keith Cohen   DOB: 1976/03/03   48 y.o. Male  MRN: [id number]  Chief Complaint  Patient presents with   Cyst    Cyst on back X 6 months. Reports it is now itchy and would like to know if it can opened up and cleaned out in office or does he need to go somewhere else. Patient reports no treatment at the moment for itching. Reports it is not constantly itching ut just comes and goes.Patient reports it is not bigger in the last few months but it has changed in size over the years. States at first you were unable to see it.    Subjective    Discussed the use of AI scribe software for clinical note transcription with the patient, who gave verbal consent to proceed.  History of Present Illness   The patient, with a history of a recent ER visit for an NSTEMI, presents for evaluation of a cyst. The patient attributes the cardiac event to exertion and energy drink consumption, and reports that their potassium was low at the time. They deny any follow-up with cardiology, despite a referral.  The cyst, present for years, has recently grown and darkened, causing itching. The patient denies any discharge from the cyst. They also report high blood pressure, which they acknowledge is a concern.      Review of Systems   Objective    BP (!) 160/104 (BP Location: Left Arm, Patient Position: Sitting, Cuff Size: Large)   Pulse 84   Ht 5\' 8"  (1.727 m)   Wt 189 lb 12.8 oz (86.1 kg)   BMI 28.86 kg/m   Physical Exam Constitutional:      General: He is not in acute distress.    Appearance: Normal appearance. He is not diaphoretic.  HENT:     Head: Normocephalic.  Eyes:     Conjunctiva/sclera: Conjunctivae normal.  Pulmonary:     Effort: Pulmonary effort is normal. No respiratory distress.  Musculoskeletal:     Comments: Lipoma of lower R back, mobile, non tender  Neurological:     Mental Status: He is alert and oriented to person, place, and time. Mental  status is at baseline.       No results found for any visits on 06/28/23.  Assessment & Plan     Problem List Items Addressed This Visit       Cardiovascular and Mediastinum   Hypertension associated with diabetes (HCC)     Other   Lipoma of torso - Primary   Relevant Orders   Ambulatory referral to General Surgery   Other Visit Diagnoses       History of non-ST elevation myocardial infarction (NSTEMI)               NSTEMI (Non-ST-Elevation Myocardial Infarction) Recent ER visit for NSTEMI with left-sided chest pain, exertion, and hypokalemia. Imaging showed no damage. Has not followed up with cardiology despite referral. Informed about the importance of cardiology follow-up for further evaluation and management. Patient declines - Encourage cardiology follow-up  Hypertension Blood pressure measured at 165/112 mmHg and 160/104 mmHg. Acknowledges the importance of managing blood pressure but prefers to discuss it at the next follow-up. - Recheck blood pressure at next month's follow-up appointment  Lipoma Lipoma present for years, recently growing, itching, and darkening. Physical exam reveals a movable, squishy mass consistent with a lipoma. Discussed that lipomas are benign and can be removed if bothersome.  -  Refer to [facility] for evaluation and removal - Provide contact information for [facility]  Follow-up - Ensure attendance at next month's follow-up appointment.       No orders of the defined types were placed in this encounter.    Return if symptoms worsen or fail to improve.      Shirlee Latch, MD  [facility] [phone] (phone) [phone] (fax)  [facility]

## 2023-07-02 ENCOUNTER — Ambulatory Visit: Payer: Managed Care, Other (non HMO) | Admitting: General Surgery

## 2023-07-11 ENCOUNTER — Ambulatory Visit: Payer: Managed Care, Other (non HMO) | Admitting: General Surgery

## 2023-07-11 ENCOUNTER — Encounter: Payer: Self-pay | Admitting: General Surgery

## 2023-07-11 VITALS — BP 152/83 | HR 76 | Temp 98.0°F | Ht 68.0 in | Wt 187.0 lb

## 2023-07-11 DIAGNOSIS — D171 Benign lipomatous neoplasm of skin and subcutaneous tissue of trunk: Secondary | ICD-10-CM | POA: Diagnosis not present

## 2023-07-11 NOTE — Patient Instructions (Signed)
We will schedule you for a removal of this are in office. You may drive yourself but may have someone with you if you like.   Lipoma  A lipoma is a noncancerous (benign) tumor that is made up of fat cells. This is a very common type of soft-tissue growth. Lipomas are usually found under the skin (subcutaneous). They may occur in any tissue of the body that contains fat. Common areas for lipomas to appear include the back, arms, shoulders, buttocks, and thighs. Lipomas grow slowly, and they are usually painless. Most lipomas do not cause problems and do not require treatment. What are the causes? The cause of this condition is not known. What increases the risk? You are more likely to develop this condition if: You are 70-33 years old. You have a family history of lipomas. What are the signs or symptoms? A lipoma usually appears as a small, round bump under the skin. In most cases, the lump will: Feel soft or rubbery. Not cause pain or other symptoms. However, if a lipoma is located in an area where it pushes on nerves, it can become painful or cause other symptoms. How is this diagnosed? A lipoma can usually be diagnosed with a physical exam. You may also have tests to confirm the diagnosis and to rule out other conditions. Tests may include: Imaging tests, such as a CT scan or an MRI. Removal of a tissue sample to be looked at under a microscope (biopsy). How is this treated? Treatment for this condition depends on the size of the lipoma and whether it is causing any symptoms. For small lipomas that are not causing problems, no treatment is needed. If a lipoma is bigger or it causes problems, surgery may be done to remove the lipoma. Lipomas can also be removed to improve appearance. Most often, the procedure is done after applying a medicine that numbs the area (local anesthetic). Liposuction may be done to reduce the size of the lipoma before it is removed through surgery, or it may be  done to remove the lipoma. Lipomas are removed with this method to limit incision size and scarring. A liposuction tube is inserted through a small incision into the lipoma, and the contents of the lipoma are removed through the tube with suction. Follow these instructions at home: Watch your lipoma for any changes. Keep all follow-up visits. This is important. Where to find more information OrthoInfo: orthoinfo.aaos.org Contact a health care provider if: Your lipoma becomes larger or hard. Your lipoma becomes painful, red, or increasingly swollen. These could be signs of infection or a more serious condition. Get help right away if: You develop tingling or numbness in an area near the lipoma. This could indicate that the lipoma is causing nerve damage. Summary A lipoma is a noncancerous tumor that is made up of fat cells. Most lipomas do not cause problems and do not require treatment. If a lipoma is bigger or it causes problems, surgery may be done to remove the lipoma. Contact a health care provider if your lipoma becomes larger or hard, or if it becomes painful, red, or increasingly swollen. These could be signs of infection or a more serious condition. This information is not intended to replace advice given to you by your health care provider. Make sure you discuss any questions you have with your health care provider. Document Revised: 06/16/2021 Document Reviewed: 06/16/2021 Elsevier Patient Education  2024 [employer].

## 2023-07-11 NOTE — Progress Notes (Signed)
Patient ID: Keith Cohen, male   DOB: 1975-10-28, 48 y.o.   MRN: [id number] CC: Right back mass History of Present Illness Keith Cohen is a 48 y.o. male with PMH significant for HTN who presents in consultation for right back lipoma. The patient reports the he noticed this mass almost 16 years ago. He reports that over that time it has grown a little bit. He has seen physician's for this but he opted not to have it removed. He reports now that he does have some pruritus over it and is now interested in getting it removed. He denies any drainage from the area. He denies any erythema, pain or redness at the area. .  Past Medical History No past medical history on file.     Past Surgical History:  Procedure Laterality Date   COLONOSCOPY WITH PROPOFOL N/A 08/22/2021   Procedure: COLONOSCOPY WITH PROPOFOL;  Surgeon: Midge Minium, MD;  Location: [facility] ENDOSCOPY;  Service: Endoscopy;  Laterality: N/A;   WISDOM TOOTH EXTRACTION  2003    No Known Allergies  Current Outpatient Medications  Medication Sig Dispense Refill   COD LIVER OIL PO Take by mouth as needed.     eszopiclone (LUNESTA) 2 MG TABS tablet TAKE 1 TABLET AT BEDTIME AS NEEDED FOR SLEEP. TAKE IMMEDIATELY BEFORE BEDTIME 90 tablet 0   hydrochlorothiazide (HYDRODIURIL) 25 MG tablet Take 25 mg by mouth daily.     Multiple Vitamin (MULTIVITAMIN) capsule Take 1 capsule by mouth daily.     No current facility-administered medications for this visit.    Family History Family History  Problem Relation Age of Onset   Diabetes Mother    Healthy Father    Diabetes Paternal Grandfather    Prostate cancer Paternal Uncle    Colon cancer Neg Hx    Breast cancer Neg Hx        Social History Social History   Tobacco Use   Smoking status: Some Days    Current packs/day: 0.25    Average packs/day: 0.3 packs/day for 15.0 years (3.8 ttl pk-yrs)    Types: Cigarettes    Passive exposure: Past   Smokeless tobacco: Former     Types: Chew   Tobacco comments:    Smokes 6 cigs per week.  Vaping Use   Vaping status: Never Used  Substance Use Topics   Alcohol use: Yes    Alcohol/week: 10.0 standard drinks of alcohol    Types: 10 Cans of beer per week   Drug use: No        ROS Full ROS of systems performed and is otherwise negative there than what is stated in the HPI  Physical Exam Blood pressure (!) 152/83, pulse 76, temperature 98 F (36.7 C), height 5\' 8"  (1.727 m), weight 187 lb (84.8 kg), SpO2 97%.  Alert and oriented x 3, NWOB on RA, RRR, abdomen is soft, non-tender, non-distended. Right back there is a soft, mobile mass that measures apprxoimately 5cm across, some hyperpigmentation of skin overlying this  Data Reviewed Last A1c done last year, 6.5  I have personally reviewed the patient's imaging and medical records.    Assessment    Patient with right back lipoma, wants it excised  Plan    Discussed r/b/a of procedure including risk of infection, bleeding, recurrence and wound complications. Hold his aspirin, will schedule for excision in office     Kandis Cocking 07/11/2023, 12:45 PM

## 2023-07-23 ENCOUNTER — Encounter: Payer: Self-pay | Admitting: General Surgery

## 2023-07-23 ENCOUNTER — Ambulatory Visit (INDEPENDENT_AMBULATORY_CARE_PROVIDER_SITE_OTHER): Payer: Managed Care, Other (non HMO) | Admitting: General Surgery

## 2023-07-23 VITALS — BP 172/112 | HR 94 | Temp 98.9°F | Ht 68.0 in | Wt 185.2 lb

## 2023-07-23 DIAGNOSIS — L723 Sebaceous cyst: Secondary | ICD-10-CM | POA: Diagnosis not present

## 2023-07-23 NOTE — Patient Instructions (Addendum)
We have removed a Cyst in our office today.  You have sutures under the skin that will dissolve and also dermabond (skin glue) on top of your skin which will come off on it's own in 10-14 days.  You may use Ibuprofen or Tylenol as needed for pain control. Use the ice pack 3-4 times a day for the next two days for any achiness.  You may shower 07/24/2023. Do not scrub at the area.   Avoid Strenuous activities that will make you sweat during the next 48 hours to avoid the glue coming off prematurely. Avoid activities that will place pressure to this area of the body for 1-2 weeks to avoid re-injury to incision site.  Please see your follow-up appointment provided. We will see you back in office to make sure this area is healed and to review the final pathology. If you have any questions or concerns prior to this appointment, call our office and speak with a nurse.    Excision of Skin Cysts or Lesions Excision of a skin lesion refers to the removal of a section of skin by making small cuts (incisions) in the skin. This procedure may be done to remove a cancerous (malignant) or noncancerous (benign) growth on the skin. It is typically done to treat or prevent cancer or infection. It may also be done to improve cosmetic appearance. The procedure may be done to remove: Cancerous growths, such as basal cell carcinoma, squamous cell carcinoma, or melanoma. Noncancerous growths, such as a cyst or lipoma. Growths, such as moles or skin tags, which may be removed for cosmetic reasons.  Various excision or surgical techniques may be used depending on your condition, the location of the lesion, and your overall health. Tell a health care provider about: Any allergies you have. All medicines you are taking, including vitamins, herbs, eye drops, creams, and over-the-counter medicines. Any problems you or family members have had with anesthetic medicines. Any blood disorders you have. Any surgeries you  have had. Any medical conditions you have. Whether you are pregnant or may be pregnant. What are the risks? Generally, this is a safe procedure. However, problems may occur, including: Bleeding. Infection. Scarring. Recurrence of the cyst, lipoma, or cancer. Changes in skin sensation or appearance, such as discoloration or swelling. Reaction to the anesthetics. Allergic reaction to surgical materials or ointments. Damage to nerves, blood vessels, muscles, or other structures. Continued pain.  What happens before the procedure? Ask your health care provider about: Changing or stopping your regular medicines. This is especially important if you are taking diabetes medicines or blood thinners. Taking medicines such as aspirin and ibuprofen. These medicines can thin your blood. Do not take these medicines before your procedure if your health care provider instructs you not to. You may be asked to take certain medicines. You may be asked to stop smoking. You may have an exam or testing. What happens during the procedure? To reduce your risk of infection: Your health care team will wash or sanitize their hands. Your skin will be washed with soap. You will be given a medicine to numb the area (local anesthetic). One of the following excision techniques will be performed. At the end of any of these procedures, antibiotic ointment will be applied as needed. Each of the following techniques may vary among health care providers and hospitals. Complete Surgical Excision The area of skin that needs to be removed will be marked with a pen. Using a small scalpel or scissors, the  surgeon will gently cut around and under the lesion until it is completely removed. The lesion will be placed in a fluid and sent to the lab for examination. If necessary, bleeding will be controlled with a device that delivers heat (electrocautery). The edges of the wound may be stitched (sutured) together, and a bandage  (dressing) or surgical glue will be applied. This procedure may be performed to treat a cancerous growth or a noncancerous cyst or lesion.  What happens after the procedure? Return to your normal activities as told by your health care provider. Report any excessive bleeding, spreading redness, or increased pain.

## 2023-07-23 NOTE — Progress Notes (Signed)
Procedure note  Preoperative diagnosis: Right lower back mass Postoperative diagnosis: Right lower back sebaceous cyst Procedure: Excision of right lower back cyst measuring approximately 3.5 cm Surgeon: Baker Pierini, MD  After informed consent was obtained the patient was placed prone on the procedure room table.  A surgical timeout was called identifying correct patient, site, side and procedure.  The lower right lower back was then prepped and draped in the usual sterile fashion.  Patient comfort was then obtained with 1% lidocaine with epinephrine.  A incision was made over the mass.  This was dissected from the underlying subcutaneous tissue and was noted to be a sebaceous cyst.  The sebaceous cyst was entered and there was expulsion of sebum.  The cyst wall was then dissected out fully from the surrounding tissue.  It was excised in pieces and passed off the table as specimen.  Once the cyst wall was completely excised the underlying subcutaneous tissue was then irrigated with saline solution.  The subcutaneous tissue was then closed with 3-0 Vicryl and the skin was closed with 4-0 Monocryl and dressed with glue.  The patient tolerated the procedure well

## 2023-07-25 LAB — SURGICAL PATHOLOGY

## 2023-07-29 ENCOUNTER — Encounter: Payer: Self-pay | Admitting: Family Medicine

## 2023-07-29 ENCOUNTER — Ambulatory Visit (INDEPENDENT_AMBULATORY_CARE_PROVIDER_SITE_OTHER): Payer: Managed Care, Other (non HMO) | Admitting: Family Medicine

## 2023-07-29 VITALS — BP 148/90 | HR 92 | Wt 192.0 lb

## 2023-07-29 DIAGNOSIS — Z0000 Encounter for general adult medical examination without abnormal findings: Secondary | ICD-10-CM

## 2023-07-29 DIAGNOSIS — E1169 Type 2 diabetes mellitus with other specified complication: Secondary | ICD-10-CM | POA: Diagnosis not present

## 2023-07-29 DIAGNOSIS — Z0001 Encounter for general adult medical examination with abnormal findings: Secondary | ICD-10-CM | POA: Diagnosis not present

## 2023-07-29 DIAGNOSIS — R809 Proteinuria, unspecified: Secondary | ICD-10-CM

## 2023-07-29 DIAGNOSIS — I252 Old myocardial infarction: Secondary | ICD-10-CM

## 2023-07-29 DIAGNOSIS — E1159 Type 2 diabetes mellitus with other circulatory complications: Secondary | ICD-10-CM | POA: Diagnosis not present

## 2023-07-29 DIAGNOSIS — I152 Hypertension secondary to endocrine disorders: Secondary | ICD-10-CM

## 2023-07-29 DIAGNOSIS — E1129 Type 2 diabetes mellitus with other diabetic kidney complication: Secondary | ICD-10-CM | POA: Diagnosis not present

## 2023-07-29 DIAGNOSIS — E785 Hyperlipidemia, unspecified: Secondary | ICD-10-CM

## 2023-07-29 NOTE — Assessment & Plan Note (Signed)
Have discussed risks of progression to CKD Patient declines ACE/ARB treatment Repeat UACR

## 2023-07-29 NOTE — Progress Notes (Signed)
 Complete physical exam   Patient: Keith Cohen   DOB: 08/27/1975   48 y.o. Male  MRN: [id number] Visit Date: 07/29/2023  Today's healthcare provider: Shirlee Latch, MD   Chief Complaint  Patient presents with   Annual Exam    Last completed 07/27/22 Diet - General, adding more fruit and vegetables and trying to cut back on red meat and sodium Exercise - Weight training twice a week for 45 minutes to an hour, treadmill twice a week maybe three times for 30 - 45 minutes  Feeling - well Sleeping - well Concerns - none   Care Management    Pneumonia Vaccines - declined   Subjective    Keith Cohen is a 48 y.o. male who presents today for a complete physical exam.    Discussed the use of AI scribe software for clinical note transcription with the patient, who gave verbal consent to proceed.  History of Present Illness   The patient, with a history of diabetes and high blood pressure, presents for a physical examination. The patient had a heart attack in November and has been managing his blood pressure with hydrochlorothiazide. The patient also had a sebaceous cyst removed recently, which has been healing well. The patient reports no current concerns or issues and states that things are "good and stable."        Last depression screening scores    07/29/2023    4:02 PM 01/28/2023    9:43 AM 07/27/2022    9:45 AM  PHQ 2/9 Scores  PHQ - 2 Score 0 0 0  PHQ- 9 Score  0 0   Last fall risk screening    07/29/2023    4:02 PM  Fall Risk   Falls in the past year? 0  Number falls in past yr: 0  Injury with Fall? 0  Risk for fall due to : No Fall Risks  Follow up Falls evaluation completed        Medications: Outpatient Medications Prior to Visit  Medication Sig   COD LIVER OIL PO Take by mouth as needed.   eszopiclone (LUNESTA) 2 MG TABS tablet TAKE 1 TABLET AT BEDTIME AS NEEDED FOR SLEEP. TAKE IMMEDIATELY BEFORE BEDTIME   hydrochlorothiazide  (HYDRODIURIL) 25 MG tablet Take 25 mg by mouth daily.   Multiple Vitamin (MULTIVITAMIN) capsule Take 1 capsule by mouth daily.   POTASSIUM PO Take by mouth.   No facility-administered medications prior to visit.    Review of Systems    Objective    BP (!) 148/90 (BP Location: Left Arm, Patient Position: Sitting, Cuff Size: Large)   Pulse 92   Wt 192 lb (87.1 kg)   SpO2 100%   BMI 29.19 kg/m    Physical Exam Vitals reviewed.  Constitutional:      General: He is not in acute distress.    Appearance: Normal appearance. He is well-developed. He is not diaphoretic.  HENT:     Head: Normocephalic and atraumatic.     Right Ear: External ear normal.     Left Ear: External ear normal.     Nose: Nose normal.     Mouth/Throat:     Mouth: Mucous membranes are moist.     Pharynx: Oropharynx is clear.  Eyes:     General: No scleral icterus.    Conjunctiva/sclera: Conjunctivae normal.  Neck:     Thyroid: No thyromegaly.  Cardiovascular:     Rate and Rhythm: Normal rate and regular  rhythm.     Heart sounds: Normal heart sounds. No murmur heard. Pulmonary:     Effort: Pulmonary effort is normal. No respiratory distress.     Breath sounds: Normal breath sounds. No wheezing or rales.  Abdominal:     General: There is no distension.     Palpations: Abdomen is soft.     Tenderness: There is no abdominal tenderness.  Musculoskeletal:        General: No deformity.     Cervical back: Neck supple.     Right lower leg: No edema.     Left lower leg: No edema.  Lymphadenopathy:     Cervical: No cervical adenopathy.  Skin:    General: Skin is warm and dry.     Findings: No rash.  Neurological:     Mental Status: He is alert and oriented to person, place, and time. Mental status is at baseline.     Gait: Gait normal.  Psychiatric:        Mood and Affect: Mood normal.        Behavior: Behavior normal.        Thought Content: Thought content normal.      No results found for any  visits on 07/29/23.  Assessment & Plan    Routine Health Maintenance and Physical Exam  Exercise Activities and Dietary recommendations  Goals      Quit smoking / using tobacco        Immunization History  Administered Date(s) Administered   PFIZER(Purple Top)SARS-COV-2 Vaccination 04/11/2020, 05/09/2020   Tdap 10/16/2010, 07/11/2021    Health Maintenance  Topic Date Due   Pneumococcal Vaccine 38-77 Years old (1 of 2 - PCV) Never done   COVID-19 Vaccine (3 - 2024-25 season) 02/10/2023   Diabetic kidney evaluation - Urine ACR  07/28/2023   INFLUENZA VACCINE  09/09/2023 (Originally 01/10/2023)   HEMOGLOBIN A1C  07/31/2023   OPHTHALMOLOGY EXAM  08/24/2023   Diabetic kidney evaluation - eGFR measurement  04/30/2024   FOOT EXAM  07/28/2024   Colonoscopy  08/22/2028   DTaP/Tdap/Td (3 - Td or Tdap) 07/12/2031   Hepatitis C Screening  Completed   HIV Screening  Completed   HPV VACCINES  Aged Out    Discussed health benefits of physical activity, and encouraged him to engage in regular exercise appropriate for his age and condition.  Problem List Items Addressed This Visit       Cardiovascular and Mediastinum   Hypertension associated with diabetes (HCC)   Blood pressure remains elevated at 148/90 mmHg despite hydrochlorothiazide 25 mg. Discussed the need for multiple medications to achieve target blood pressure and the importance of home monitoring. - Continue hydrochlorothiazide 25 mg - patient declines additional agent for HTN - Recommend home blood pressure monitoring - Schedule three-month follow-up      Relevant Orders   Comprehensive metabolic panel     Endocrine   Hyperlipidemia associated with type 2 diabetes mellitus (HCC)   Cholesterol levels were high in November. Emphasized the need for a fasting lipid panel to assess cholesterol levels. - Order fasting lipid panel - patient declines statin      Relevant Orders   Comprehensive metabolic panel   Lipid  Panel With LDL/HDL Ratio   Diabetes mellitus (HCC)   Diabetes is well-managed. Urine sample collected for UACR. Last A1c checked six months ago. Discussed regular eye exams to monitor for diabetic retinopathy. - Send urine sample for UACR - Order A1c test - Schedule eye exam  for next month      Relevant Orders   Microalbumin / creatinine urine ratio   Hemoglobin A1c   Microalbuminuria due to type 2 diabetes mellitus (HCC)   Have discussed risks of progression to CKD Patient declines ACE/ARB treatment Repeat UACR      Relevant Orders   Microalbumin / creatinine urine ratio   Other Visit Diagnoses       Encounter for annual physical exam    -  Primary   Relevant Orders   Comprehensive metabolic panel   Lipid Panel With LDL/HDL Ratio   Hemoglobin A1c     History of non-ST elevation myocardial infarction (NSTEMI)               NSTEMI (Non-ST-Elevation Myocardial Infarction) Had an NSTEMI in November. Emphasized managing risk factors (cholesterol, blood pressure, diabetes) to prevent further cardiac events. Highlighted the need for cardiology follow-up. - Follow up with cardiology  Postoperative Care for Sebaceous Cyst Removal Sebaceous cyst removed, healing well with dissolvable sutures and glue. Follow-up with surgeon scheduled for February 25. - Follow up with surgeon on February 25  General Health Maintenance Tetanus shot is up to date. Declined pneumonia shot. Colonoscopy up to date until 2030. - Ensure tetanus shot remains up to date - Colonoscopy up to date until 2030  Follow-up - Schedule three-month follow-up - Provide lab slip for fasting lipid panel and A1c - Review lab results when available.         Return in about 3 months (around 10/26/2023) for chronic disease f/u.     Shirlee Latch, MD  [facility] [phone] (phone) [phone] (fax)  [facility]

## 2023-07-29 NOTE — Assessment & Plan Note (Signed)
 Cholesterol levels were high in November. Emphasized the need for a fasting lipid panel to assess cholesterol levels. - Order fasting lipid panel - patient declines statin

## 2023-07-29 NOTE — Assessment & Plan Note (Signed)
 Diabetes is well-managed. Urine sample collected for UACR. Last A1c checked six months ago. Discussed regular eye exams to monitor for diabetic retinopathy. - Send urine sample for UACR - Order A1c test - Schedule eye exam for next month

## 2023-07-29 NOTE — Assessment & Plan Note (Signed)
 Blood pressure remains elevated at 148/90 mmHg despite hydrochlorothiazide 25 mg. Discussed the need for multiple medications to achieve target blood pressure and the importance of home monitoring. - Continue hydrochlorothiazide 25 mg - patient declines additional agent for HTN - Recommend home blood pressure monitoring - Schedule three-month follow-up

## 2023-07-30 ENCOUNTER — Encounter: Payer: Self-pay | Admitting: Family Medicine

## 2023-07-30 LAB — MICROALBUMIN / CREATININE URINE RATIO
Creatinine, Urine: 16.3 mg/dL
Microalb/Creat Ratio: 107 mg/g{creat} — ABNORMAL HIGH (ref 0–29)
Microalbumin, Urine: 17.4 ug/mL

## 2023-08-06 ENCOUNTER — Ambulatory Visit (INDEPENDENT_AMBULATORY_CARE_PROVIDER_SITE_OTHER): Payer: Managed Care, Other (non HMO) | Admitting: General Surgery

## 2023-08-06 ENCOUNTER — Encounter: Payer: Self-pay | Admitting: General Surgery

## 2023-08-06 VITALS — BP 169/103 | HR 81 | Temp 98.9°F | Ht 68.0 in | Wt 193.6 lb

## 2023-08-06 DIAGNOSIS — Z09 Encounter for follow-up examination after completed treatment for conditions other than malignant neoplasm: Secondary | ICD-10-CM

## 2023-08-06 DIAGNOSIS — L723 Sebaceous cyst: Secondary | ICD-10-CM

## 2023-08-06 NOTE — Patient Instructions (Signed)
 Excision of Skin Lesions, Care After The following information offers guidance on how to care for yourself after your procedure. Your health care provider may also give you more specific instructions. If you have problems or questions, contact your health care provider. What can I expect after the procedure? After your procedure, it is common to have: Soreness or mild pain. Some redness and swelling. Follow these instructions at home: Excision site care  Follow instructions from your health care provider about how to take care of your excision site. Make sure you: Wash your hands with soap and water for at least 20 seconds before and after you change your bandage (dressing). If soap and water are not available, use hand sanitizer. Change your dressing as told by your health care provider. Leave stitches (sutures), skin glue, or adhesive strips in place. These skin closures may need to stay in place for 2 weeks or longer. If adhesive strip edges start to loosen and curl up, you may trim the loose edges. Do not remove adhesive strips completely unless your health care provider tells you to do that. Check the excision area every day for signs of infection. Watch for: More redness, swelling, or pain. Fluid or blood. Warmth. Pus or a bad smell. Keep the site clean, dry, and protected for at least 48 hours. For bleeding, apply gentle but firm pressure to the area using a folded towel for 20 minutes. Do not take baths, swim, or use a hot tub until your health care provider approves. Ask your health care provider if you may take showers. You may only be allowed to take sponge baths. General instructions Take over-the-counter and prescription medicines only as told by your health care provider. Follow instructions from your health care provider about how to minimize scarring. Scarring should lessen over time. Avoid sun exposure until the area has healed. Use sunscreen to protect the area from the sun  after it has healed. Avoid high-impact exercise and activities until the sutures are removed or the area heals. Keep all follow-up visits. This is important. Contact a health care provider if: You have more redness, swelling, or pain around your excision site. You have fluid or blood coming from your excision site. Your excision site feels warm to the touch. You have pus or a bad smell coming from your excision site. You have a fever. You have pain that does not improve in 2-3 days after your procedure. Get help right away if: You have bleeding that does not stop with pressure or a dressing. Your wound opens up. Summary Take over-the-counter and prescription medicines only as told by your health care provider. Change your dressing as told by your health care provider. Contact a health care provider if you have redness, swelling, pain, or other signs of infection around your excision site. Keep all follow-up visits. This is important. This information is not intended to replace advice given to you by your health care provider. Make sure you discuss any questions you have with your health care provider. Document Revised: 12/27/2020 Document Reviewed: 12/27/2020 Elsevier Patient Education  2024 [employer].

## 2023-08-06 NOTE — Progress Notes (Signed)
 Outpatient Surgical Follow Up  08/06/2023  Keith Cohen is an 48 y.o. male.   Chief Complaint  Patient presents with   Follow-up    Excision back cyst 07/23/23    HPI: Patient returns today s/p excision of back cyst. He reports that he has had some bleeding from the wound. HE has not been dressing it with anything so it is draining blood on his t-shirt. He reports some swelling. No fevers or chills. No purulent drainage.   History reviewed. No pertinent past medical history.  Past Surgical History:  Procedure Laterality Date   COLONOSCOPY WITH PROPOFOL N/A 08/22/2021   Procedure: COLONOSCOPY WITH PROPOFOL;  Surgeon: Midge Minium, MD;  Location: [facility] ENDOSCOPY;  Service: Endoscopy;  Laterality: N/A;   WISDOM TOOTH EXTRACTION  2003    Family History  Problem Relation Age of Onset   Diabetes Mother    Healthy Father    Diabetes Paternal Grandfather    Prostate cancer Paternal Uncle    Colon cancer Neg Hx    Breast cancer Neg Hx     Social History:  reports that he has been smoking cigarettes. He has a 3.8 pack-year smoking history. He has been exposed to tobacco smoke. He has quit using smokeless tobacco.  His smokeless tobacco use included chew. He reports current alcohol use of about 10.0 standard drinks of alcohol per week. He reports that he does not use drugs.  Allergies: No Known Allergies  Medications reviewed.    ROS Full ROS performed and is otherwise negative other than what is stated in HPI   BP (!) 169/103   Pulse 81   Temp 98.9 F (37.2 C) (Oral)   Ht 5\' 8"  (1.727 m)   Wt 193 lb 9.6 oz (87.8 kg)   SpO2 97%   BMI 29.44 kg/m   Physical Exam Right back cyst excision site healing well wtihout erythemna. At the right most part of the incision there is drainage of old blood. There is no surrounding erythema or purulence.     No results found for this or any previous visit (from the past 48 hours). No results found.  Pathology discussed with  patient and consistent with  Epidermoid cyst with inflammation  Assessment/Plan:  Patient s/p back cyst excision with a hematoma. This is necessitating out of incision. No signs of infection. I dressed wound with gauze. We will see him again in 4 weeks to make sure this heals appropriately.    Baker Pierini, M.D. [facility]

## 2023-08-07 LAB — COMPREHENSIVE METABOLIC PANEL
ALT: 61 IU/L — ABNORMAL HIGH (ref 0–44)
AST: 45 IU/L — ABNORMAL HIGH (ref 0–40)
Albumin: 4.2 g/dL (ref 4.1–5.1)
Alkaline Phosphatase: 37 IU/L — ABNORMAL LOW (ref 44–121)
BUN/Creatinine Ratio: 11 (ref 9–20)
BUN: 9 mg/dL (ref 6–24)
Bilirubin Total: 0.9 mg/dL (ref 0.0–1.2)
CO2: 22 mmol/L (ref 20–29)
Calcium: 9.2 mg/dL (ref 8.7–10.2)
Chloride: 103 mmol/L (ref 96–106)
Creatinine, Ser: 0.84 mg/dL (ref 0.76–1.27)
Globulin, Total: 2.4 g/dL (ref 1.5–4.5)
Glucose: 111 mg/dL — ABNORMAL HIGH (ref 70–99)
Potassium: 4.1 mmol/L (ref 3.5–5.2)
Sodium: 141 mmol/L (ref 134–144)
Total Protein: 6.6 g/dL (ref 6.0–8.5)
eGFR: 108 mL/min/{1.73_m2} (ref >59)

## 2023-08-07 LAB — LIPID PANEL WITH LDL/HDL RATIO
Cholesterol, Total: 265 mg/dL — ABNORMAL HIGH (ref 100–199)
HDL: 21 mg/dL — ABNORMAL LOW (ref >39)
LDL Chol Calc (NIH): 226 mg/dL — ABNORMAL HIGH (ref 0–99)
LDL/HDL Ratio: 10.8 ratio — ABNORMAL HIGH (ref 0.0–3.6)
Triglycerides: 100 mg/dL (ref 0–149)
VLDL Cholesterol Cal: 18 mg/dL (ref 5–40)

## 2023-08-07 LAB — HEMOGLOBIN A1C
Est. average glucose Bld gHb Est-mCnc: 143 mg/dL
Hgb A1c MFr Bld: 6.6 % — ABNORMAL HIGH (ref 4.8–5.6)

## 2023-09-03 ENCOUNTER — Encounter: Payer: Self-pay | Admitting: General Surgery

## 2023-09-03 ENCOUNTER — Ambulatory Visit (INDEPENDENT_AMBULATORY_CARE_PROVIDER_SITE_OTHER): Payer: Managed Care, Other (non HMO) | Admitting: General Surgery

## 2023-09-03 VITALS — BP 160/101 | HR 83 | Ht 68.0 in | Wt 192.0 lb

## 2023-09-03 DIAGNOSIS — L723 Sebaceous cyst: Secondary | ICD-10-CM

## 2023-09-03 DIAGNOSIS — Z09 Encounter for follow-up examination after completed treatment for conditions other than malignant neoplasm: Secondary | ICD-10-CM | POA: Diagnosis not present

## 2023-09-03 NOTE — Patient Instructions (Signed)
   Follow-up with our office as needed.  Please call and ask to speak with a nurse if you develop questions or concerns.

## 2023-09-03 NOTE — Progress Notes (Signed)
 Outpatient Surgical Follow Up CC: Excision of right back cyst. 09/03/2023  Keith Cohen is an 48 y.o. male.   Chief Complaint  Patient presents with   Follow-up    HPI: Patient returns status post excision of right back cyst.  He had a little hematoma there that had necessitated.  He returns today in follow-up.  He says that there has been no further drainage.  He does note some induration tissue and swelling in the area but that has improved.  He denies any fevers or chills.  He denies any pain.  No past medical history on file.  Past Surgical History:  Procedure Laterality Date   COLONOSCOPY WITH PROPOFOL N/A 08/22/2021   Procedure: COLONOSCOPY WITH PROPOFOL;  Surgeon: Midge Minium, MD;  Location: [facility] ENDOSCOPY;  Service: Endoscopy;  Laterality: N/A;   WISDOM TOOTH EXTRACTION  2003    Family History  Problem Relation Age of Onset   Diabetes Mother    Healthy Father    Diabetes Paternal Grandfather    Prostate cancer Paternal Uncle    Colon cancer Neg Hx    Breast cancer Neg Hx     Social History:  reports that he has been smoking cigarettes. He has a 3.8 pack-year smoking history. He has been exposed to tobacco smoke. He has quit using smokeless tobacco.  His smokeless tobacco use included chew. He reports current alcohol use of about 10.0 standard drinks of alcohol per week. He reports that he does not use drugs.  Allergies: No Known Allergies  Medications reviewed.    ROS Full ROS performed and is otherwise negative other than what is stated in HPI   BP (!) 160/101   Pulse 83   Ht 5\' 8"  (1.727 m)   Wt 192 lb (87.1 kg)   SpO2 99%   BMI 29.19 kg/m   Physical Exam Right back excision site does have induration tissue but it is nontender.  There is a small amount of serosanguineous fluid that was expressed from the wound during palpation.  There is no surrounding cellulitis changes from the incision site.    No results found for this or any previous  visit (from the past 48 hours). No results found.  Assessment/Plan:  Patient is status post excision of right back cyst.  He did have a hematoma that necessitated out the wound.  This is drained and there is no further bleeding.  I discussed with him that the incision looks good and there is no evidence of infection.  He can place gauze over this as needed.  I instructed him that he can start to use vitamin E cream for the scar and to roll it with a tennis ball or stress ball to help flatten out the tissue.  He can follow-up with [place] on an as-needed basis   Baker Pierini, M.D. [facility]

## 2023-10-08 ENCOUNTER — Encounter: Payer: Self-pay | Admitting: Family Medicine

## 2023-10-09 NOTE — Telephone Encounter (Signed)
 Ok to give him the phone number for [facility] in [place].    FYI - this is an example of a message that does not need to be forwarded to the provider. There are notes from [facility] in the chart trying to reach him. (I know it might not be the person that sent it that receives this back, but wanted to provide feedback. Thanks!

## 2023-10-31 ENCOUNTER — Encounter: Payer: Self-pay | Admitting: Family Medicine

## 2023-10-31 ENCOUNTER — Ambulatory Visit: Payer: Managed Care, Other (non HMO) | Admitting: Family Medicine

## 2023-10-31 VITALS — BP 158/88 | HR 92 | Ht 68.0 in | Wt 184.2 lb

## 2023-10-31 DIAGNOSIS — R809 Proteinuria, unspecified: Secondary | ICD-10-CM

## 2023-10-31 DIAGNOSIS — E559 Vitamin D deficiency, unspecified: Secondary | ICD-10-CM

## 2023-10-31 DIAGNOSIS — E785 Hyperlipidemia, unspecified: Secondary | ICD-10-CM

## 2023-10-31 DIAGNOSIS — E1159 Type 2 diabetes mellitus with other circulatory complications: Secondary | ICD-10-CM

## 2023-10-31 DIAGNOSIS — E1129 Type 2 diabetes mellitus with other diabetic kidney complication: Secondary | ICD-10-CM

## 2023-10-31 DIAGNOSIS — E663 Overweight: Secondary | ICD-10-CM

## 2023-10-31 DIAGNOSIS — I152 Hypertension secondary to endocrine disorders: Secondary | ICD-10-CM

## 2023-10-31 DIAGNOSIS — E1169 Type 2 diabetes mellitus with other specified complication: Secondary | ICD-10-CM | POA: Diagnosis not present

## 2023-10-31 MED ORDER — LOSARTAN POTASSIUM 100 MG PO TABS: 100.0000 mg | ORAL_TABLET | Freq: Every day | ORAL | 1 refills | Status: DC

## 2023-10-31 NOTE — Assessment & Plan Note (Signed)
 Hypertension remains elevated at 158/88 mmHg despite losartan  50 mg. Genetic factors and lifestyle contribute to his condition. Increasing losartan  is necessary for better control and kidney protection. - Increase losartan  to 100 mg daily. - Recheck blood pressure in 3 months. - Emphasize the importance of diet and exercise in managing hypertension.

## 2023-10-31 NOTE — Assessment & Plan Note (Signed)
 Continue losartan.

## 2023-10-31 NOTE — Assessment & Plan Note (Signed)
 Diabetes management is ongoing with a target A1c below 7%. He is not on prescription cholesterol medication but uses a supplement, which will be evaluated with upcoming labs. - Order A1c test to monitor diabetes control. - Order cholesterol test to evaluate the effect of the supplement.

## 2023-10-31 NOTE — Progress Notes (Signed)
 Established patient visit   Patient: Keith Cohen   DOB: [birth year]   48 y.o. Male  MRN: [id number] Visit Date: 10/31/2023  Today's healthcare provider: Aden Agreste, MD   Chief Complaint  Patient presents with   Medical Management of Chronic Issues   Hypertension    Pt was advised to continue hydrochlorothiazide  25 mg. Patient reports he is taking losartan  but not sure of mg. States it was previously prescribed. Patient does not monitor at home. Symptoms: none   Hyperlipidemia    Pt was advised to start crestor 5 mg Symptoms: none   Diabetes    Well controlled currently not being treated. Pt reports consuming  Symptoms: none Eye Exam: last year, [facility], advised over due for exam   Subjective    Hypertension  Hyperlipidemia  Diabetes   HPI     Hypertension    Additional comments: Pt was advised to continue hydrochlorothiazide  25 mg. Patient reports he is taking losartan  but not sure of mg. States it was previously prescribed. Patient does not monitor at home. Symptoms: none        Hyperlipidemia    Additional comments: Pt was advised to start crestor 5 mg Symptoms: none        Diabetes    Additional comments: Well controlled currently not being treated. Pt reports consuming  Symptoms: none Eye Exam: last year, [facility], advised over due for exam      Last edited by Pasty Bongo, CMA on 10/31/2023  4:25 PM.       Discussed the use of AI scribe software for clinical note transcription with the patient, who gave verbal consent to proceed.  History of Present Illness   Keith Treanor "DEZ" is a 48 year old male with hypertension and elevated microalbuminuria who presents for a follow-up on blood pressure management and kidney protection.  He is currently taking losartan  50 mg daily, which he had previously stopped but has now resumed. This medication is used for both blood pressure control and kidney protection. He has a  history of elevated microalbumin to creatinine ratio, indicating microscopic kidney damage.  He is not on prescription cholesterol medication but started a non-prescription supplement in March after his last cholesterol check in February. He is interested in its effect on his cholesterol levels.  There is a family history of hypertension, with his father managing it through lifestyle changes. 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         Medications: Outpatient Medications Prior to Visit  Medication Sig   aspirin  EC 81 MG tablet Take 81 mg by mouth daily. Swallow whole.   COD LIVER OIL PO Take by mouth as needed.   eszopiclone  (LUNESTA ) 2 MG TABS tablet TAKE 1 TABLET AT BEDTIME AS NEEDED FOR SLEEP. TAKE IMMEDIATELY BEFORE BEDTIME   Multiple Vitamin (MULTIVITAMIN) capsule Take 1 capsule by mouth daily.   POTASSIUM PO Take by mouth.   [DISCONTINUED] hydrochlorothiazide  (HYDRODIURIL ) 25 MG tablet Take 25 mg by mouth daily. (Patient not taking: Reported on 10/31/2023)   No facility-administered medications prior to visit.    Review of Systems     Objective    BP (!) 158/88 (BP Location: Left Arm, Patient Position: Sitting, Cuff Size: Large)   Pulse 92   Ht 5\' 8"  (1.727 m)   Wt 184 lb 3.2 oz (83.6 kg)   SpO2 96%  BMI 28.01 kg/m    Physical Exam Vitals reviewed.  Constitutional:      General: He is not in acute distress.    Appearance: Normal appearance. He is not diaphoretic.  HENT:     Head: Normocephalic and atraumatic.  Eyes:     General: No scleral icterus.    Conjunctiva/sclera: Conjunctivae normal.  Cardiovascular:     Rate and Rhythm: Normal rate and regular rhythm.     Heart sounds: Normal heart sounds. No murmur heard. Pulmonary:     Effort: Pulmonary effort is normal. No respiratory distress.     Breath sounds: Normal breath sounds. No wheezing or rhonchi.  Musculoskeletal:      Cervical back: Neck supple.     Right lower leg: No edema.     Left lower leg: No edema.  Lymphadenopathy:     Cervical: No cervical adenopathy.  Skin:    General: Skin is warm and dry.     Findings: No rash.  Neurological:     Mental Status: He is alert and oriented to person, place, and time. Mental status is at baseline.  Psychiatric:        Mood and Affect: Mood normal.        Behavior: Behavior normal.      No results found for any visits on 10/31/23.  Assessment & Plan     Problem List Items Addressed This Visit       Cardiovascular and Mediastinum   Hypertension associated with diabetes (HCC)   Hypertension remains elevated at 158/88 mmHg despite losartan  50 mg. Genetic factors and lifestyle contribute to his condition. Increasing losartan  is necessary for better control and kidney protection. - Increase losartan  to 100 mg daily. - Recheck blood pressure in 3 months. - Emphasize the importance of diet and exercise in managing hypertension.      Relevant Medications   aspirin  EC 81 MG tablet   losartan  (COZAAR ) 100 MG tablet   Other Relevant Orders   Comprehensive metabolic panel with GFR   Lipid panel     Endocrine   Hyperlipidemia associated with type 2 diabetes mellitus (HCC)   Taking OTC, not statin Recheck statin      Relevant Medications   aspirin  EC 81 MG tablet   losartan  (COZAAR ) 100 MG tablet   Other Relevant Orders   Comprehensive metabolic panel with GFR   Lipid panel   Diabetes mellitus (HCC) - Primary   Diabetes management is ongoing with a target A1c below 7%. He is not on prescription cholesterol medication but uses a supplement, which will be evaluated with upcoming labs. - Order A1c test to monitor diabetes control. - Order cholesterol test to evaluate the effect of the supplement.      Relevant Medications   aspirin  EC 81 MG tablet   losartan  (COZAAR ) 100 MG tablet   Other Relevant Orders   Hemoglobin A1c   Microalbuminuria  due to type 2 diabetes mellitus (HCC)   Continue losartan       Relevant Medications   aspirin  EC 81 MG tablet   losartan  (COZAAR ) 100 MG tablet     Other   Avitaminosis D   Overweight (BMI 25.0-29.9)        General Health Maintenance Encouraged to maintain a healthy lifestyle, including regular aerobic exercise, to support overall health and manage hypertension. - Encourage regular aerobic exercise.  Follow-up Follow-up is planned to monitor response to increased losartan  and review lab results. - Schedule follow-up appointment in 3  months. - Review lab results once available.       Return in about 3 months (around 01/31/2024) for chronic disease f/u.       Aden Agreste, MD  [facility] [phone] (phone) [phone] (fax)  [facility]

## 2023-10-31 NOTE — Assessment & Plan Note (Signed)
 Taking OTC, not statin Recheck statin

## 2023-11-02 LAB — LIPID PANEL
Chol/HDL Ratio: 14.4 ratio — ABNORMAL HIGH (ref 0.0–5.0)
Cholesterol, Total: 302 mg/dL — ABNORMAL HIGH (ref 100–199)
HDL: 21 mg/dL — ABNORMAL LOW (ref >39)
LDL Chol Calc (NIH): 238 mg/dL — ABNORMAL HIGH (ref 0–99)
Triglycerides: 210 mg/dL — ABNORMAL HIGH (ref 0–149)
VLDL Cholesterol Cal: 43 mg/dL — ABNORMAL HIGH (ref 5–40)

## 2023-11-02 LAB — COMPREHENSIVE METABOLIC PANEL WITH GFR
ALT: 50 IU/L — ABNORMAL HIGH (ref 0–44)
AST: 44 IU/L — ABNORMAL HIGH (ref 0–40)
Albumin: 4.6 g/dL (ref 4.1–5.1)
Alkaline Phosphatase: 42 IU/L — ABNORMAL LOW (ref 44–121)
BUN/Creatinine Ratio: 16 (ref 9–20)
BUN: 13 mg/dL (ref 6–24)
Bilirubin Total: 1.1 mg/dL (ref 0.0–1.2)
CO2: 20 mmol/L (ref 20–29)
Calcium: 10.5 mg/dL — ABNORMAL HIGH (ref 8.7–10.2)
Chloride: 99 mmol/L (ref 96–106)
Creatinine, Ser: 0.83 mg/dL (ref 0.76–1.27)
Globulin, Total: 2.7 g/dL (ref 1.5–4.5)
Glucose: 108 mg/dL — ABNORMAL HIGH (ref 70–99)
Potassium: 4.5 mmol/L (ref 3.5–5.2)
Sodium: 139 mmol/L (ref 134–144)
Total Protein: 7.3 g/dL (ref 6.0–8.5)
eGFR: 108 mL/min/{1.73_m2} (ref >59)

## 2023-11-02 LAB — HEMOGLOBIN A1C
Est. average glucose Bld gHb Est-mCnc: 120 mg/dL
Hgb A1c MFr Bld: 5.8 % — ABNORMAL HIGH (ref 4.8–5.6)

## 2023-11-05 ENCOUNTER — Ambulatory Visit: Payer: Self-pay | Admitting: Family Medicine

## 2023-11-05 DIAGNOSIS — E1169 Type 2 diabetes mellitus with other specified complication: Secondary | ICD-10-CM

## 2023-11-12 MED ORDER — ROSUVASTATIN CALCIUM 5 MG PO TABS: 5.0000 mg | ORAL_TABLET | Freq: Every day | ORAL | 1 refills | Status: DC

## 2024-01-05 NOTE — Progress Notes (Unsigned)
 Cardiology Office Note  Date:  01/06/2024   ID:  Keith Cohen, [place] [birth year], MRN [id number]  PCP:  Myrla Jon HERO, MD   Chief Complaint  Patient presents with   New Patient (Initial Visit)    Ref by Dr. Bacigalupo for chest discomfort. Patient was seen at [facility] ER in November 2024 with chest pain and was told had an NSTEMI.    HPI:  Keith Cohen is a 48 y.o. male with past medical history of: Hypertension Diabetes Hyperlipidemia Who presents by referral from Dr. 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  Thinks his blood pressure running little bit high but has not been checking it at home Thinks it typically runs 140 to 150 Elevated on initial check, mild improvement on recheck but diastolic still elevated in the 90s Reports taking losartan  100 daily  Has not been taking Crestor  consistently Numbers reviewed, cholesterol level consistently 200 up to 350  CT scan chest images pulled up and reviewed no significant coronary calcification or aortic atherosclerosis  EKG personally reviewed by myself on todays visit EKG Interpretation Date/Time:  Monday January 06 2024 10:55:45 EDT Ventricular Rate:  80 PR Interval:  130 QRS Duration:  90 QT Interval:  388 QTC Calculation: 447 R Axis:   48  Text Interpretation: Normal sinus rhythm Normal ECG When compared with ECG of 01-May-2023 15:33, No significant change was found Confirmed by Perla Lye [phone]) on 01/06/2024 11:18:54 AM    PMH:   has a past medical history of Hyperlipidemia and Hypertension.  PSH:    Past Surgical History:   Procedure Laterality Date   COLONOSCOPY WITH PROPOFOL  N/A 08/22/2021   Procedure: COLONOSCOPY WITH PROPOFOL ;  Surgeon: Jinny Carmine, MD;  Location: [facility] ENDOSCOPY;  Service: Endoscopy;  Laterality: N/A;   WISDOM TOOTH EXTRACTION  2003    Current Outpatient Medications  Medication Sig Dispense Refill   aspirin  EC 81 MG tablet Take 81 mg by mouth daily. Swallow whole.     COD LIVER OIL PO Take by mouth as needed.     eszopiclone  (LUNESTA ) 2 MG TABS tablet TAKE 1 TABLET AT BEDTIME AS NEEDED FOR SLEEP. TAKE IMMEDIATELY BEFORE BEDTIME 90 tablet 0   losartan  (COZAAR ) 100 MG tablet Take 1 tablet (100 mg total) by mouth daily. 90 tablet 1   Multiple Vitamin (MULTIVITAMIN) capsule Take 1 capsule by mouth daily.     POTASSIUM PO Take by mouth.     rosuvastatin  (CRESTOR ) 5 MG tablet Take 1 tablet (5 mg total) by mouth daily. 90 tablet 1   No current facility-administered medications for this visit.    Allergies:   Patient has no known allergies.   Social History:  The patient  reports that he has been smoking cigarettes. He has a 3.8 pack-year smoking history. He has been exposed to tobacco smoke. He has quit using smokeless tobacco.  His smokeless tobacco use included chew. He reports current alcohol use of about 10.0 standard drinks of alcohol per week. He reports that he does not use drugs.   Family History:   family history includes Diabetes in his mother and paternal grandfather; Healthy in his father;  Prostate cancer in his paternal uncle.    Review of Systems: Review of Systems  Constitutional: Negative.   HENT: Negative.    Respiratory: Negative.    Cardiovascular: Negative.   Gastrointestinal: Negative.   Musculoskeletal: Negative.   Neurological: Negative.   Psychiatric/Behavioral: Negative.    All other systems reviewed and are negative.   PHYSICAL EXAM: VS:  BP (!) 140/95 (BP Location: Left Arm)   Pulse 80   Ht 5' 8 (1.727 m)   Wt 178 lb 6 oz (80.9 kg)   SpO2 98%   BMI  27.12 kg/m  , BMI Body mass index is 27.12 kg/m. GEN: Well nourished, well developed, in no acute distress HEENT: normal Neck: no JVD, carotid bruits, or masses Cardiac: RRR; no murmurs, rubs, or gallops,no edema  Respiratory:  clear to auscultation bilaterally, normal work of breathing GI: soft, nontender, nondistended, + BS MS: no deformity or atrophy Skin: warm and dry, no rash Neuro:  Strength and sensation are intact Psych: euthymic mood, full affect   Recent Labs: 05/01/2023: B Natriuretic Peptide 14.2; TSH 1.017 05/02/2023: Hemoglobin 16.4; Platelets 261 11/01/2023: ALT 50; BUN 13; Creatinine, Ser 0.83; Potassium 4.5; Sodium 139    Lipid Panel Lab Results  Component Value Date   CHOL 302 (H) 11/01/2023   HDL 21 (L) 11/01/2023   LDLCALC 238 (H) 11/01/2023   TRIG 210 (H) 11/01/2023      Wt Readings from Last 3 Encounters:  01/06/24 178 lb 6 oz (80.9 kg)  10/31/23 184 lb 3.2 oz (83.6 kg)  09/03/23 192 lb (87.1 kg)       ASSESSMENT AND PLAN:  Problem List Items Addressed This Visit       Cardiology Problems   Hyperlipidemia associated with type 2 diabetes mellitus (HCC) - Primary   Hypertension associated with diabetes (HCC)   Relevant Orders   EKG 12-Lead (Completed)     Other   Chest pain   Relevant Orders   EKG 12-Lead (Completed)   Atypical chest pain Workup in the hospital with minimally elevated nontrending troponin likely from LVH in the setting of hypertension Likely not a non-STEMI Echo essentially normal, EKG today normal Reports feeling well with no anginal symptoms He is declining additional workup with cardiac CTA at this time as he is not having symptoms  Given the above, low risk of underlying coronary disease  Hyperlipidemia Markedly elevated numbers, reports he is not taking Crestor  in a consistent fashion Does not want to change the dosing at this time  Smoker We have encouraged her to continue to work on weaning her cigarettes  and smoking cessation. She will continue to work on this and does not want any assistance with chantix.    Mr. Swor was seen in consultation for Dr.Bacigalupo and will be referred back to her office for ongoing care of the issues detailed above   Signed, Velinda Lunger, M.D., Ph.D. [facility] [place], [place] [phone]

## 2024-01-06 ENCOUNTER — Encounter: Payer: Self-pay | Admitting: Cardiovascular Disease

## 2024-01-06 ENCOUNTER — Ambulatory Visit: Attending: Cardiovascular Disease | Admitting: Cardiovascular Disease

## 2024-01-06 VITALS — BP 140/95 | HR 80 | Ht 68.0 in | Wt 178.4 lb

## 2024-01-06 DIAGNOSIS — E785 Hyperlipidemia, unspecified: Secondary | ICD-10-CM

## 2024-01-06 DIAGNOSIS — E1169 Type 2 diabetes mellitus with other specified complication: Secondary | ICD-10-CM | POA: Diagnosis not present

## 2024-01-06 DIAGNOSIS — E1159 Type 2 diabetes mellitus with other circulatory complications: Secondary | ICD-10-CM | POA: Diagnosis not present

## 2024-01-06 DIAGNOSIS — R079 Chest pain, unspecified: Secondary | ICD-10-CM

## 2024-01-06 DIAGNOSIS — I152 Hypertension secondary to endocrine disorders: Secondary | ICD-10-CM

## 2024-01-06 NOTE — Patient Instructions (Addendum)
 Medication Instructions:  No changes  If you need a refill on your cardiac medications before your next appointment, please call your pharmacy.   Lab work: No new labs needed  Testing/Procedures: No new testing needed  Follow-Up: At [facility], you and your health needs are our priority.  As part of our continuing mission to provide you with exceptional heart care, we have created designated Provider Care Teams.  These Care Teams include your primary Cardiologist (physician) and Advanced Practice Providers (APPs -  Physician Assistants and Nurse Practitioners) who all work together to provide you with the care you need, when you need it.  You will need a follow up as needed  Providers on your designated Care Team:   Lonni Meager, NP Bernardino Bring, PA-C Cadence Franc[place]ster, [place]  COVID-19 Vaccine Information can be found at: [email or web address] For questions related to vaccine distribution or appointments, please email [email or web address] or call [phone].

## 2024-02-03 ENCOUNTER — Encounter: Payer: Self-pay | Admitting: Family Medicine

## 2024-02-03 ENCOUNTER — Ambulatory Visit (INDEPENDENT_AMBULATORY_CARE_PROVIDER_SITE_OTHER): Admitting: Family Medicine

## 2024-02-03 VITALS — BP 124/84 | HR 74 | Ht 68.0 in | Wt 187.3 lb

## 2024-02-03 DIAGNOSIS — E1159 Type 2 diabetes mellitus with other circulatory complications: Secondary | ICD-10-CM | POA: Diagnosis not present

## 2024-02-03 DIAGNOSIS — R809 Proteinuria, unspecified: Secondary | ICD-10-CM

## 2024-02-03 DIAGNOSIS — I152 Hypertension secondary to endocrine disorders: Secondary | ICD-10-CM

## 2024-02-03 DIAGNOSIS — E1169 Type 2 diabetes mellitus with other specified complication: Secondary | ICD-10-CM

## 2024-02-03 DIAGNOSIS — E1129 Type 2 diabetes mellitus with other diabetic kidney complication: Secondary | ICD-10-CM

## 2024-02-03 DIAGNOSIS — F17200 Nicotine dependence, unspecified, uncomplicated: Secondary | ICD-10-CM | POA: Diagnosis not present

## 2024-02-03 DIAGNOSIS — G4726 Circadian rhythm sleep disorder, shift work type: Secondary | ICD-10-CM

## 2024-02-03 DIAGNOSIS — E785 Hyperlipidemia, unspecified: Secondary | ICD-10-CM

## 2024-02-03 NOTE — Assessment & Plan Note (Signed)
 Nicotine dependence is a significant risk factor for cardiovascular health. Discussed with cardiologist as a major area for improvement. Cardiologist does not believe recent symptoms were due to a heart attack, but emphasizes the importance of managing cholesterol and smoking cessation.

## 2024-02-03 NOTE — Assessment & Plan Note (Signed)
 Intermittent insomnia managed with Lunesta  2 mg as needed. - Continue Lunesta  2 mg QHS PRN for insomnia

## 2024-02-03 NOTE — Assessment & Plan Note (Signed)
 Hypertension is well-controlled on current medication regimen. Blood pressure readings are within target range. Losartan  is used for both blood pressure control and kidney protection. Reports chest discomfort potentially related to aspirin  intake, advised to take aspirin  with meals to mitigate gastrointestinal effects. - Continue losartan  100 mg daily - Advise taking two 50 mg tablets if 100 mg tablets are unavailable - Take aspirin  with meals to reduce gastrointestinal discomfort

## 2024-02-03 NOTE — Assessment & Plan Note (Signed)
 Microalbuminuria is managed with losartan , providing kidney protection. Regular monitoring of kidney function is necessary. - Order kidney function tests

## 2024-02-03 NOTE — Progress Notes (Signed)
 Established patient visit   Patient: Keith Cohen   DOB: [birth year]   48 y.o. Male  MRN: [id number] Visit Date: 02/03/2024  Today's healthcare provider: Jon Eva, MD   Chief Complaint  Patient presents with   Diabetes    Keith Cohen is a 48 y.o. male who presents for follow up of diabetes. Patient denies any symptoms, states that he does not monitor sugars at home.  Reported that his sugar is controllable.    Eye exam- last year, [facility]  Declined vaccines   Subjective    Diabetes   HPI     Diabetes    Additional comments: Keith Cohen is a 48 y.o. male who presents for follow up of diabetes. Patient denies any symptoms, states that he does not monitor sugars at home.  Reported that his sugar is controllable.    Eye exam- last year, [facility]  Declined vaccines      Last edited by Terrel Powell CROME, CMA on 02/03/2024  9:57 AM.       Discussed the use of AI scribe software for clinical note transcription with the patient, who gave verbal consent to proceed.  History of Present Illness   Keith Cohen is a 48 year old male with diabetes, hypertension, hyperlipidemia, and microalbuminuria who presents for chronic follow-up.  He takes losartan  100 mg daily, rosuvastatin  5 mg daily, and baby aspirin . Lunesta  2 mg is used as needed for insomnia. He experiences chest discomfort later in the day after taking aspirin  and losartan , which is noticeable but not severe.  His LDL cholesterol was previously over 200 mg/dL and is managed with rosuvastatin . He is due for lab work to check A1c, cholesterol, kidney, and liver function, as it has been three months since his last tests. He has not had his annual diabetic eye exam this year due to discomfort from the dilation process.  A recent cardiology visit did not find evidence of a heart attack, despite a previous ER visit where it was suspected. The cardiologist  discussed elevated troponin levels and considered the possibility of strain from high blood pressure.         Medications: Outpatient Medications Prior to Visit  Medication Sig   aspirin  EC 81 MG tablet Take 81 mg by mouth daily. Swallow whole.   COD LIVER OIL PO Take by mouth as needed.   eszopiclone  (LUNESTA ) 2 MG TABS tablet TAKE 1 TABLET AT BEDTIME AS NEEDED FOR SLEEP. TAKE IMMEDIATELY BEFORE BEDTIME   losartan  (COZAAR ) 100 MG tablet Take 1 tablet (100 mg total) by mouth daily.   Multiple Vitamin (MULTIVITAMIN) capsule Take 1 capsule by mouth daily.   POTASSIUM PO Take by mouth.   rosuvastatin  (CRESTOR ) 5 MG tablet Take 1 tablet (5 mg total) by mouth daily.   No facility-administered medications prior to visit.    Review of Systems     Objective    BP 124/84 (BP Location: Left Arm, Patient Position: Sitting, Cuff Size: Large)   Pulse 74   Ht 5' 8 (1.727 m)   Wt 187 lb 4.8 oz (85 kg)   SpO2 98%   BMI 28.48 kg/m    Physical Exam Vitals reviewed.  Constitutional:      General: He is not in acute distress.    Appearance: Normal appearance. He is not diaphoretic.  HENT:     Head: Normocephalic and atraumatic.  Eyes:     General: No  scleral icterus.    Conjunctiva/sclera: Conjunctivae normal.  Cardiovascular:     Rate and Rhythm: Normal rate and regular rhythm.     Heart sounds: Normal heart sounds. No murmur heard. Pulmonary:     Effort: Pulmonary effort is normal. No respiratory distress.     Breath sounds: Normal breath sounds. No wheezing or rhonchi.  Musculoskeletal:     Cervical back: Neck supple.     Right lower leg: No edema.     Left lower leg: No edema.  Lymphadenopathy:     Cervical: No cervical adenopathy.  Skin:    General: Skin is warm and dry.     Findings: No rash.  Neurological:     Mental Status: He is alert and oriented to person, place, and time. Mental status is at baseline.  Psychiatric:        Mood and Affect: Mood normal.         Behavior: Behavior normal.      No results found for any visits on 02/03/24.  Assessment & Plan     Problem List Items Addressed This Visit       Cardiovascular and Mediastinum   Hypertension associated with diabetes (HCC) - Primary   Hypertension is well-controlled on current medication regimen. Blood pressure readings are within target range. Losartan  is used for both blood pressure control and kidney protection. Reports chest discomfort potentially related to aspirin  intake, advised to take aspirin  with meals to mitigate gastrointestinal effects. - Continue losartan  100 mg daily - Advise taking two 50 mg tablets if 100 mg tablets are unavailable - Take aspirin  with meals to reduce gastrointestinal discomfort      Relevant Orders   Comprehensive metabolic panel with GFR     Endocrine   Hyperlipidemia associated with type 2 diabetes mellitus (HCC)   Hyperlipidemia with elevated LDL cholesterol levels. Currently on rosuvastatin  5 mg daily. Awaiting lab results to assess effectiveness of current dosage. Target LDL is under 100 mg/dL, current levels have been over 200 mg/dL. - Order lipid panel to assess cholesterol levels - Continue rosuvastatin  5 mg daily      Relevant Orders   Comprehensive metabolic panel with GFR   Lipid panel   Diabetes mellitus (HCC)   Type 2 diabetes mellitus requires regular monitoring of blood glucose levels. Due for A1c testing to assess current glycemic control. Annual diabetic eye exam is recommended despite previous discomfort with dilation; alternative methods like retinal photography may be considered. - Order A1c test - Recommend annual diabetic eye exam      Relevant Orders   Hemoglobin A1c   Microalbuminuria due to type 2 diabetes mellitus (HCC)   Microalbuminuria is managed with losartan , providing kidney protection. Regular monitoring of kidney function is necessary. - Order kidney function tests        Other   Sleep disorder,  shift-work   Intermittent insomnia managed with Lunesta  2 mg as needed. - Continue Lunesta  2 mg QHS PRN for insomnia       Tobacco dependence   Nicotine dependence is a significant risk factor for cardiovascular health. Discussed with cardiologist as a major area for improvement. Cardiologist does not believe recent symptoms were due to a heart attack, but emphasizes the importance of managing cholesterol and smoking cessation.        Return in about 6 months (around 08/05/2024) for CPE.       Jon Eva, MD  [facility] [phone] (phone) [phone] (fax)  [facility]

## 2024-02-03 NOTE — Assessment & Plan Note (Signed)
 Hyperlipidemia with elevated LDL cholesterol levels. Currently on rosuvastatin  5 mg daily. Awaiting lab results to assess effectiveness of current dosage. Target LDL is under 100 mg/dL, current levels have been over 200 mg/dL. - Order lipid panel to assess cholesterol levels - Continue rosuvastatin  5 mg daily

## 2024-02-03 NOTE — Assessment & Plan Note (Signed)
 Type 2 diabetes mellitus requires regular monitoring of blood glucose levels. Due for A1c testing to assess current glycemic control. Annual diabetic eye exam is recommended despite previous discomfort with dilation; alternative methods like retinal photography may be considered. - Order A1c test - Recommend annual diabetic eye exam

## 2024-03-10 ENCOUNTER — Other Ambulatory Visit: Payer: Self-pay | Admitting: Family Medicine

## 2024-03-10 NOTE — Telephone Encounter (Signed)
 [facility] faxed refill request for the following medications:  losartan  (COZAAR ) 100 MG tablet    Please advise.

## 2024-03-11 NOTE — Telephone Encounter (Signed)
 Contacted patient to verify he would like order sent to mail order and he requested to disregard as he is [facility]. I verbalized understanding. Rx not sent as he should have enough remaining until November.   Pt also requested a refill for his Lunesta .  Rx refill request will be sent to provider. Patient verbalized understanding.   LRF 04/09/23 #[id number] LOV 02/03/24 NOV 08/06/24

## 2024-03-12 ENCOUNTER — Ambulatory Visit: Payer: Self-pay | Admitting: Family Medicine

## 2024-03-12 LAB — COMPREHENSIVE METABOLIC PANEL WITH GFR
ALT: 56 IU/L — ABNORMAL HIGH (ref 0–44)
AST: 49 IU/L — ABNORMAL HIGH (ref 0–40)
Albumin: 4.8 g/dL (ref 4.1–5.1)
Alkaline Phosphatase: 57 IU/L (ref 47–123)
BUN/Creatinine Ratio: 14 (ref 9–20)
BUN: 11 mg/dL (ref 6–24)
Bilirubin Total: 1.3 mg/dL — ABNORMAL HIGH (ref 0.0–1.2)
CO2: 25 mmol/L (ref 20–29)
Calcium: 9.7 mg/dL (ref 8.7–10.2)
Chloride: 96 mmol/L (ref 96–106)
Creatinine, Ser: 0.8 mg/dL (ref 0.76–1.27)
Globulin, Total: 2.8 g/dL (ref 1.5–4.5)
Glucose: 114 mg/dL — ABNORMAL HIGH (ref 70–99)
Potassium: 3.6 mmol/L (ref 3.5–5.2)
Sodium: 138 mmol/L (ref 134–144)
Total Protein: 7.6 g/dL (ref 6.0–8.5)
eGFR: 108 mL/min/1.73 (ref >59)

## 2024-03-12 LAB — LIPID PANEL
Chol/HDL Ratio: 3.7 ratio (ref 0.0–5.0)
Cholesterol, Total: 166 mg/dL (ref 100–199)
HDL: 45 mg/dL (ref >39)
LDL Chol Calc (NIH): 107 mg/dL — ABNORMAL HIGH (ref 0–99)
Triglycerides: 76 mg/dL (ref 0–149)
VLDL Cholesterol Cal: 14 mg/dL (ref 5–40)

## 2024-03-12 LAB — HEMOGLOBIN A1C
Est. average glucose Bld gHb Est-mCnc: 126 mg/dL
Hgb A1c MFr Bld: 6 % — ABNORMAL HIGH (ref 4.8–5.6)

## 2024-03-12 MED ORDER — ESZOPICLONE 2 MG PO TABS: 2.0000 mg | ORAL_TABLET | Freq: Every evening | ORAL | 0 refills | Status: AC | PRN

## 2024-03-16 ENCOUNTER — Telehealth: Payer: Self-pay | Admitting: Family Medicine

## 2024-03-16 ENCOUNTER — Other Ambulatory Visit: Payer: Self-pay

## 2024-03-16 DIAGNOSIS — E1169 Type 2 diabetes mellitus with other specified complication: Secondary | ICD-10-CM

## 2024-03-16 MED ORDER — LOSARTAN POTASSIUM 100 MG PO TABS: 100.0000 mg | ORAL_TABLET | Freq: Every day | ORAL | 1 refills | Status: DC

## 2024-03-16 MED ORDER — ROSUVASTATIN CALCIUM 5 MG PO TABS
5.0000 mg | ORAL_TABLET | Freq: Every day | ORAL | 1 refills | Status: DC
Start: 2024-03-16 — End: 2024-03-20

## 2024-03-16 NOTE — Telephone Encounter (Signed)
 Converted

## 2024-03-16 NOTE — Telephone Encounter (Addendum)
 [facility] faxed refill request for the following medications:  rosuvastatin  (CRESTOR ) 5 MG tablet   losartan  (COZAAR ) 100 MG tablet   Please advise.

## 2024-03-20 ENCOUNTER — Other Ambulatory Visit: Payer: Self-pay | Admitting: Family Medicine

## 2024-03-20 DIAGNOSIS — E1169 Type 2 diabetes mellitus with other specified complication: Secondary | ICD-10-CM

## 2024-03-20 NOTE — Telephone Encounter (Signed)
 Copied from CRM [id number]. Topic: Clinical - Medication Refill >> Mar 20, 2024 11:59 AM Amy B wrote: Medication:  rosuvastatin  (CRESTOR ) 5 MG tablet losartan  (COZAAR ) 100 MG tablet PHARMACY REQUESTS A 90-DAY SUPPLY WITH THREE REFILLS  Has the patient contacted their pharmacy? Yes (Agent: If no, request that the patient contact the pharmacy for the refill. If patient does not wish to contact the pharmacy document the reason why and proceed with request.) (Agent: If yes, when and what did the pharmacy advise?)   [facility] - [place], [place] - [address] [place] [address] Phone: [phone] Fax: [phone]  Is this the correct pharmacy for this prescription? Yes If no, delete pharmacy and type the correct one.   Has the prescription been filled recently? No  Is the patient out of the medication? Yes  Has the patient been seen for an appointment in the last year OR does the patient have an upcoming appointment? Yes  Can we respond through MyChart? Yes  Agent: Please be advised that Rx refills may take up to 3 business days. We ask that you follow-up with your pharmacy.

## 2024-03-23 MED ORDER — ROSUVASTATIN CALCIUM 5 MG PO TABS: 5.0000 mg | ORAL_TABLET | Freq: Every day | ORAL | 1 refills | Status: AC

## 2024-03-23 MED ORDER — LOSARTAN POTASSIUM 100 MG PO TABS: 100.0000 mg | ORAL_TABLET | Freq: Every day | ORAL | 1 refills | Status: AC

## 2024-03-23 NOTE — Telephone Encounter (Signed)
 Requested Prescriptions  Pending Prescriptions Disp Refills   losartan  (COZAAR ) 100 MG tablet 90 tablet 1    Sig: Take 1 tablet (100 mg total) by mouth daily.     Cardiovascular:  Angiotensin Receptor Blockers Passed - 03/23/2024  4:23 PM      Passed - Cr in normal range and within 180 days    Creatinine, Ser  Date Value Ref Range Status  03/11/2024 0.80 0.76 - 1.27 mg/dL Final         Passed - K in normal range and within 180 days    Potassium  Date Value Ref Range Status  03/11/2024 3.6 3.5 - 5.2 mmol/L Final         Passed - Patient is not pregnant      Passed - Last BP in normal range    BP Readings from Last 1 Encounters:  02/03/24 124/84         Passed - Valid encounter within last 6 months    Recent Outpatient Visits           1 month ago Hypertension associated with diabetes [facility])   [place] [facility] [place], Jon HERO, MD   4 months ago Type 2 diabetes mellitus with diabetic microalbuminuria, without long-term current use of insulin  [facility])   [place] [facility] [place], Jon HERO, MD   7 months ago Encounter for annual physical exam   [place] [facility] [place], Jon HERO, MD       Future Appointments             In 4 months Bacigalupo, Jon HERO, MD [facility], [place]             rosuvastatin  (CRESTOR ) 5 MG tablet 90 tablet 1    Sig: Take 1 tablet (5 mg total) by mouth daily.     Cardiovascular:  Antilipid - Statins 2 Failed - 03/23/2024  4:23 PM      Failed - Lipid Panel in normal range within the last 12 months    Cholesterol, Total  Date Value Ref Range Status  03/11/2024 166 100 - 199 mg/dL Final   LDL Chol Calc (NIH)  Date Value Ref Range Status  03/11/2024 107 (H) 0 - 99 mg/dL Final   HDL  Date Value Ref Range Status  03/11/2024 45 >39 mg/dL Final   Triglycerides  Date Value Ref Range Status  03/11/2024 76 0 - 149 mg/dL Final          Passed - Cr in normal range and within 360 days    Creatinine, Ser  Date Value Ref Range Status  03/11/2024 0.80 0.76 - 1.27 mg/dL Final         Passed - Patient is not pregnant      Passed - Valid encounter within last 12 months    Recent Outpatient Visits           1 month ago Hypertension associated with diabetes [facility])   [place] [facility] [place], Jon HERO, MD   4 months ago Type 2 diabetes mellitus with diabetic microalbuminuria, without long-term current use of insulin  [facility])    [facility] [place], Jon HERO, MD   7 months ago Encounter for annual physical exam   [facility] [place], Jon HERO, MD       Future Appointments             In 4 months  Bacigalupo, Jon HERO, MD [facility], [place]

## 2024-08-06 ENCOUNTER — Encounter: Admitting: Family Medicine
